# Patient Record
Sex: Male | Born: 1995 | Race: White | Hispanic: No | Marital: Single | Smoking: Never smoker
Health system: Southern US, Community
[De-identification: ages and names within clinical notes are randomized; demographics above are authoritative.]

## PROBLEM LIST (undated history)

## (undated) DIAGNOSIS — H548 Legal blindness, as defined in USA: Secondary | ICD-10-CM

## (undated) DIAGNOSIS — J690 Pneumonitis due to inhalation of food and vomit: Secondary | ICD-10-CM

## (undated) DIAGNOSIS — I10 Essential (primary) hypertension: Secondary | ICD-10-CM

## (undated) DIAGNOSIS — T4145XA Adverse effect of unspecified anesthetic, initial encounter: Secondary | ICD-10-CM

## (undated) DIAGNOSIS — G808 Other cerebral palsy: Secondary | ICD-10-CM

## (undated) DIAGNOSIS — R112 Nausea with vomiting, unspecified: Secondary | ICD-10-CM

## (undated) DIAGNOSIS — Z8489 Family history of other specified conditions: Secondary | ICD-10-CM

## (undated) DIAGNOSIS — J189 Pneumonia, unspecified organism: Secondary | ICD-10-CM

## (undated) DIAGNOSIS — T8859XA Other complications of anesthesia, initial encounter: Secondary | ICD-10-CM

## (undated) DIAGNOSIS — H539 Unspecified visual disturbance: Secondary | ICD-10-CM

## (undated) DIAGNOSIS — K219 Gastro-esophageal reflux disease without esophagitis: Secondary | ICD-10-CM

## (undated) DIAGNOSIS — E739 Lactose intolerance, unspecified: Secondary | ICD-10-CM

## (undated) DIAGNOSIS — K5900 Constipation, unspecified: Secondary | ICD-10-CM

## (undated) DIAGNOSIS — B974 Respiratory syncytial virus as the cause of diseases classified elsewhere: Secondary | ICD-10-CM

## (undated) DIAGNOSIS — Z9889 Other specified postprocedural states: Secondary | ICD-10-CM

## (undated) DIAGNOSIS — R569 Unspecified convulsions: Secondary | ICD-10-CM

## (undated) DIAGNOSIS — B338 Other specified viral diseases: Secondary | ICD-10-CM

## (undated) HISTORY — PX: HIATAL HERNIA REPAIR: SHX195

## (undated) HISTORY — PX: OTHER SURGICAL HISTORY: SHX169

## (undated) HISTORY — PX: ADENOIDECTOMY: SUR15

## (undated) HISTORY — PX: GASTROSTOMY TUBE PLACEMENT: SHX655

---
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
## 2016-02-05 DIAGNOSIS — I7300 Raynaud's syndrome without gangrene: Secondary | ICD-10-CM | POA: Insufficient documentation

## 2016-02-21 DIAGNOSIS — K089 Disorder of teeth and supporting structures, unspecified: Secondary | ICD-10-CM | POA: Insufficient documentation

## 2016-03-11 ENCOUNTER — Emergency Department (HOSPITAL_COMMUNITY): Payer: Medicaid Other

## 2016-03-11 ENCOUNTER — Emergency Department (HOSPITAL_COMMUNITY)
Admission: EM | Admit: 2016-03-11 | Discharge: 2016-03-11 | Disposition: A | Discharge status: Home / Self Care | Payer: Medicaid Other | Attending: Emergency Medicine | Admitting: Emergency Medicine

## 2016-03-11 ENCOUNTER — Encounter (HOSPITAL_COMMUNITY): Payer: Self-pay | Admitting: Emergency Medicine

## 2016-03-11 DIAGNOSIS — H663X9 Other chronic suppurative otitis media, unspecified ear: Secondary | ICD-10-CM | POA: Diagnosis not present

## 2016-03-11 DIAGNOSIS — R938 Abnormal findings on diagnostic imaging of other specified body structures: Secondary | ICD-10-CM | POA: Insufficient documentation

## 2016-03-11 DIAGNOSIS — R0682 Tachypnea, not elsewhere classified: Secondary | ICD-10-CM

## 2016-03-11 DIAGNOSIS — J40 Bronchitis, not specified as acute or chronic: Secondary | ICD-10-CM | POA: Diagnosis not present

## 2016-03-11 DIAGNOSIS — Z79899 Other long term (current) drug therapy: Secondary | ICD-10-CM | POA: Insufficient documentation

## 2016-03-11 DIAGNOSIS — R0602 Shortness of breath: Secondary | ICD-10-CM | POA: Diagnosis present

## 2016-03-11 DIAGNOSIS — I10 Essential (primary) hypertension: Secondary | ICD-10-CM | POA: Diagnosis not present

## 2016-03-11 LAB — CBC WITH DIFFERENTIAL/PLATELET
Basophils Absolute: 0 10*3/uL (ref 0.0–0.1)
Basophils Relative: 0 %
EOS ABS: 0.1 10*3/uL (ref 0.0–0.7)
EOS PCT: 1 %
HCT: 51.3 % (ref 39.0–52.0)
Hemoglobin: 17.8 g/dL — ABNORMAL HIGH (ref 13.0–17.0)
LYMPHS ABS: 3.3 10*3/uL (ref 0.7–4.0)
Lymphocytes Relative: 34 %
MCH: 31.1 pg (ref 26.0–34.0)
MCHC: 34.7 g/dL (ref 30.0–36.0)
MCV: 89.7 fL (ref 78.0–100.0)
Monocytes Absolute: 0.9 10*3/uL (ref 0.1–1.0)
Monocytes Relative: 9 %
Neutro Abs: 5.4 10*3/uL (ref 1.7–7.7)
Neutrophils Relative %: 56 %
PLATELETS: 255 10*3/uL (ref 150–400)
RBC: 5.72 MIL/uL (ref 4.22–5.81)
RDW: 12.9 % (ref 11.5–15.5)
WBC: 9.7 10*3/uL (ref 4.0–10.5)

## 2016-03-11 LAB — I-STAT CG4 LACTIC ACID, ED: LACTIC ACID, VENOUS: 1.78 mmol/L (ref 0.5–1.9)

## 2016-03-11 LAB — BASIC METABOLIC PANEL
ANION GAP: 12 (ref 5–15)
BUN: 15 mg/dL (ref 6–20)
CALCIUM: 10.1 mg/dL (ref 8.9–10.3)
CO2: 25 mmol/L (ref 22–32)
CREATININE: 0.73 mg/dL (ref 0.61–1.24)
Chloride: 104 mmol/L (ref 101–111)
GFR calc non Af Amer: >60 mL/min (ref >60)
Glucose, Bld: 102 mg/dL — ABNORMAL HIGH (ref 65–99)
Potassium: 4 mmol/L (ref 3.5–5.1)
SODIUM: 141 mmol/L (ref 135–145)

## 2016-03-11 LAB — I-STAT VENOUS BLOOD GAS, ED
ACID-BASE EXCESS: 4 mmol/L — AB (ref 0.0–2.0)
Bicarbonate: 28.8 mmol/L — ABNORMAL HIGH (ref 20.0–28.0)
O2 Saturation: 79 %
TCO2: 30 mmol/L (ref 0–100)
pCO2, Ven: 40.6 mmHg — ABNORMAL LOW (ref 44.0–60.0)
pH, Ven: 7.459 — ABNORMAL HIGH (ref 7.250–7.430)
pO2, Ven: 41 mmHg (ref 32.0–45.0)

## 2016-03-11 LAB — URINALYSIS, ROUTINE W REFLEX MICROSCOPIC
BILIRUBIN URINE: NEGATIVE
GLUCOSE, UA: NEGATIVE mg/dL
HGB URINE DIPSTICK: NEGATIVE
KETONES UR: NEGATIVE mg/dL
LEUKOCYTES UA: NEGATIVE
Nitrite: NEGATIVE
PROTEIN: NEGATIVE mg/dL
Specific Gravity, Urine: 1.009 (ref 1.005–1.030)
pH: 8 (ref 5.0–8.0)

## 2016-03-11 LAB — RAPID STREP SCREEN (MED CTR MEBANE ONLY): STREPTOCOCCUS, GROUP A SCREEN (DIRECT): NEGATIVE

## 2016-03-11 MED ORDER — CEFTRIAXONE SODIUM 1 G IJ SOLR
1.0000 g | Freq: Once | INTRAMUSCULAR | Status: AC
  Administered 2016-03-11: 1 g via INTRAMUSCULAR
  Filled 2016-03-11: qty 10

## 2016-03-11 MED ORDER — ACETAMINOPHEN 160 MG/5ML PO SOLN
1000.0000 mg | Freq: Once | ORAL | Status: AC
  Administered 2016-03-11: 1000 mg
  Filled 2016-03-11: qty 40.6

## 2016-03-11 MED ORDER — LAMOTRIGINE 150 MG PO TABS
150.0000 mg | ORAL_TABLET | Freq: Two times a day (BID) | ORAL | Status: DC
  Administered 2016-03-11: 150 mg
  Filled 2016-03-11 (×2): qty 1

## 2016-03-11 MED ORDER — CETIRIZINE HCL 5 MG/5ML PO SYRP
10.0000 mg | ORAL_SOLUTION | Freq: Every day | ORAL | Status: DC
  Administered 2016-03-11: 10 mg
  Filled 2016-03-11: qty 10

## 2016-03-11 MED ORDER — LIDOCAINE HCL (PF) 1 % IJ SOLN
INTRAMUSCULAR | Status: AC
  Administered 2016-03-11: 2.1 mL
  Filled 2016-03-11: qty 5

## 2016-03-11 MED ORDER — CLONAZEPAM 0.125 MG PO TBDP
0.5000 mg | ORAL_TABLET | Freq: Once | ORAL | Status: AC
  Administered 2016-03-11: 0.5 mg
  Filled 2016-03-11: qty 4

## 2016-03-11 MED ORDER — GLYCOPYRROLATE 1 MG PO TABS
3.0000 mg | ORAL_TABLET | Freq: Two times a day (BID) | ORAL | Status: DC
  Administered 2016-03-11: 3 mg
  Filled 2016-03-11: qty 3

## 2016-03-11 MED ORDER — PANTOPRAZOLE SODIUM 40 MG PO PACK
20.0000 mg | PACK | Freq: Two times a day (BID) | ORAL | Status: DC
  Administered 2016-03-11: 20 mg
  Filled 2016-03-11: qty 20

## 2016-03-11 MED ORDER — LEVALBUTEROL HCL 1.25 MG/0.5ML IN NEBU
1.2500 mg | INHALATION_SOLUTION | Freq: Once | RESPIRATORY_TRACT | Status: AC
  Administered 2016-03-11: 1.25 mg via RESPIRATORY_TRACT
  Filled 2016-03-11: qty 0.5

## 2016-03-11 MED ORDER — METOPROLOL TARTRATE 25 MG PO TABS
37.5000 mg | ORAL_TABLET | Freq: Two times a day (BID) | ORAL | Status: DC
  Administered 2016-03-11: 37.5 mg
  Filled 2016-03-11: qty 2

## 2016-03-11 MED ORDER — LEVALBUTEROL HCL 0.63 MG/3ML IN NEBU
0.6300 mg | INHALATION_SOLUTION | Freq: Once | RESPIRATORY_TRACT | Status: AC
  Administered 2016-03-11: 0.63 mg via RESPIRATORY_TRACT
  Filled 2016-03-11: qty 3

## 2016-03-11 MED ORDER — SODIUM CHLORIDE 0.9 % IV BOLUS (SEPSIS)
1000.0000 mL | Freq: Once | INTRAVENOUS | Status: AC
  Administered 2016-03-11: 1000 mL via INTRAVENOUS

## 2016-03-11 MED ORDER — IOPAMIDOL (ISOVUE-370) INJECTION 76%
INTRAVENOUS | Status: AC
  Administered 2016-03-11: 100 mL
  Filled 2016-03-11: qty 100

## 2016-03-11 MED ORDER — LEVETIRACETAM 100 MG/ML PO SOLN
700.0000 mg | Freq: Two times a day (BID) | ORAL | Status: DC
  Administered 2016-03-11: 700 mg
  Filled 2016-03-11: qty 7.5

## 2016-03-11 NOTE — Discharge Instructions (Signed)
Please follow up with Dr. Chestine Sporelark as scheduled tomorrow.  Return if worse at any time.

## 2016-03-11 NOTE — ED Provider Notes (Signed)
This is an 20 year old male with cerebral palsy, seizure disorder, chronic ear infections signed out to me by Dr. Clarene DukeLittle. He has been being followed for  a multi-drug-resistant Escherichia coli ear infection and bronchitis. He received Rocephin in the office yesterday and was scheduled to receive a second dose today. After he presented to the ED to have some increased work of breathing and they were concerned that he had pneumonia and was sent to the ED for further evaluation. He was seen and evaluated by Dr. Clarene DukeLittle with lab work and chest x-Peterson Mathey. Lab work is normal and chest x-Latima Hamza did not seem to show any acute infiltrate. Due to the difficult evaluation of this patient and concern for occult infection a CT of the chest and abdomen was performed. There appears to be no acute infection. In the interim he has had resolution of grunting that was present earlier. His oxygen saturations are 95% with heart rate in the 90s and he is mild to moderately hypertensive. His mother feels that he is improved and looks better than he has a past 36 hours. He is given his dose of Rocephin here in the ED. I discussed the patient's care with Dr. Pricilla Holmucker, on-call for Dr. Chestine Sporelark. He is scheduled to be followed up in the morning with Dr. Chestine Sporelark. He is discharged home in improved condition.   Margarita Grizzleanielle Tifani Dack, MD 03/11/16 2242

## 2016-03-11 NOTE — ED Triage Notes (Signed)
Pt caregiver states "hes having a lot of issues breathing this morning" pt went to pediatricians and was sent here, [facility] started [facility], yesterday c/o issue breathing,pt did series of antibiotics for ear pain. Pt has cerebral palsy, is nonverbal. Pt extensive hx of respiratory issues. Pts breathing is very difficult. Caregiver states he looks very poor.

## 2016-03-11 NOTE — ED Notes (Signed)
Pt's blood pressure 151/122.  Informed Molly, RN.

## 2016-03-11 NOTE — ED Notes (Signed)
Pharmacist notified on pt.'s pending medication order.  

## 2016-03-11 NOTE — ED Notes (Signed)
Patient transported to CT scan . 

## 2016-03-11 NOTE — ED Notes (Signed)
Dr. Rosalia Hammersay explained tests results and plan of care to pt.'s family .

## 2016-03-11 NOTE — ED Notes (Signed)
Pharmacist notified for pt.'s Protonix order.

## 2016-03-11 NOTE — ED Provider Notes (Signed)
[facility]-EMERGENCY DEPT Provider Note   CSN: [id number] Arrival date & time: 03/11/16  1259     History   Chief Complaint Chief Complaint  Patient presents with  . Shortness of Breath    HPI Christopher Caldwell is a 20 y.o. male.  20yo M w/ PMH including cerebral palsy, chronic ear infections, seizures, quadriplegia who presents with respiratory distress. Caregiver reports that the patient has intermittent episodes of increased work of breathing that usually last one hour and spontaneously resolve. Yesterday morning, he began having respiratory difficulty and the symptoms have persisted throughout yesterday and today. He has chronic ear infections and yesterday was started on ceftriaxone. He returned to the PCP today for a second dose and was sent here due to his breathing difficulty. She states that he has an occasional weak cough. No fever. He did have some dry heaving this morning. No diarrhea.  LEVEL 5 CAVEAT DUE TO PATIENT NON-VERBAL   The history is provided by a caregiver.  Shortness of Breath     Past Medical History:  Diagnosis Date  . Aspiration pneumonia (HCC)   . Asthma   . Cerebral palsy, quadriplegic (HCC)   . Complication of anesthesia   . Constipation   . Family history of adverse reaction to anesthesia    Mother - Vomitting, Flu symptoms  . Hypertension   . Lactose intolerance   . Legally blind   . Pneumonia   . PONV (postoperative nausea and vomiting)    did not get sick with 07/13/14 OR- with medication and he woke up better.  . RSV (respiratory syncytial virus infection)   . Seizures (HCC)   . Vision abnormalities     Patient Active Problem List   Diagnosis Date Noted  . Respiratory illness with fever 08/10/2015  . Dyspnea 08/09/2015  . Infection due to human metapneumovirus (hMPV)   . Aspiration pneumonia (HCC)   . Developmental delay   . Viral illness   . Dehydration   . Respiratory distress 04/17/2014    Past Surgical History:  Procedure  Laterality Date  . ADENOIDECTOMY    . CHOLECYSTECTOMY  08/2014  . GASTROSTOMY TUBE PLACEMENT     age 65 months- replaced - 05/2014  . hamstring resection Bilateral   . HIATAL HERNIA REPAIR    . MYRINGOTOMY  03/26/2011   Procedure: MYRINGOTOMY;  Surgeon: Leonette Most;  Location: [facility] OR;  Service: ENT;  Laterality: Bilateral;  BILATERAL MYRINGOTOMY   . MYRINGOTOMY WITH TUBE PLACEMENT Bilateral 01/06/2014   Procedure: MYRINGOTOMY WITH TUBE PLACEMENT;  Surgeon: Suzanna Obey, MD;  Location: [facility] OR;  Service: ENT;  Laterality: Bilateral;  Bilateral myringotomy with tube placement and nasal endoscopy  . MYRINGOTOMY WITH TUBE PLACEMENT Bilateral 07/13/2014   Procedure: MYRINGOTOMY WITH TUBE PLACEMENT;  Surgeon: Suzanna Obey, MD;  Location[facility] OR;  Service: ENT;  Laterality: Bilateral;  bilateral T Tubes  . MYRINGOTOMY WITH TUBE PLACEMENT Bilateral 04/20/2015   Procedure: MYRINGOTOMY WITH TUBE PLACEMENT;  Surgeon: Suzanna Obey, MD;  Location: [facility] OR;  Service: ENT;  Laterality: Bilateral;  . MYRINGOTOMY WITH TUBE PLACEMENT Bilateral 10/15/2015   Procedure: MYRINGOTOMY WITH TUBE PLACEMENT;  Surgeon: Suzanna Obey, MD;  Location: [facility] OR;  Service: ENT;  Laterality: Bilateral;  sheehy tubes   . Nissan Plundication         Home Medications    Prior to Admission medications   Medication Sig Start Date End Date Taking? Authorizing Provider  cetirizine HCl (ZYRTEC) 5 MG/5ML SYRP Place 10  mg into feeding tube daily.   Yes Historical Provider, MD  ciprofloxacin-dexamethasone (CIPRODEX) otic suspension Place 5 drops into both ears 2 (two) times daily.   Yes Historical Provider, MD  clonazePAM (KLONOPIN) 0.5 MG tablet 0.5 mg by PEG Tube route 4 (four) times daily. Times to take it at 4am, 10am, 3pm, 7pm    Yes Historical Provider, MD  diazepam (DIASTAT ACUDIAL) 10 MG GEL Place 15 mg rectally as needed for seizure.    Yes Historical Provider, MD  diazepam (VALIUM) 1 MG/ML solution Place 15 mg into feeding tube 3 (three)  times daily as needed for muscle spasms (seizures. up to three times within 30 minutes).    Yes Historical Provider, MD  fluconazole (DIFLUCAN) 40 MG/ML suspension Take 400 mg by mouth daily.   Yes Historical Provider, MD  glycopyrrolate (ROBINUL) 1 MG tablet Place 3 mg into feeding tube 2 (two) times daily. Dissolve in water and Give via G tube.   Yes Historical Provider, MD  lactobacillus acidophilus (BACID) TABS tablet Place 1 tablet into feeding tube daily.    Yes Historical Provider, MD  lamoTRIgine (LAMICTAL) 150 MG tablet Place 150 mg into feeding tube 2 (two) times daily. Dissolve in water and give via G tube.   Yes Historical Provider, MD  levalbuterol (XOPENEX) 0.63 MG/3ML nebulizer solution Take 3 mLs (0.63 mg total) by nebulization every 4 (four) hours as needed for wheezing or shortness of breath. Patient taking differently: Take 0.63 mg by nebulization 3 (three) times daily.  04/19/14  Yes Warnell ForesterAkilah Grimes, MD  levETIRAcetam (KEPPRA) 100 MG/ML solution Place into feeding tube 2 (two) times daily. Give at 6AM, 5:30pm   Yes Historical Provider, MD  metoprolol tartrate (LOPRESSOR) 25 MG tablet Place 37.5 mg into feeding tube 2 (two) times daily. Dissolve in water and give via G Tube.   Yes Historical Provider, MD  pantoprazole sodium (PROTONIX) 40 mg/20 mL PACK Place 20 mg into feeding tube 2 (two) times daily.    Yes Historical Provider, MD  polyethylene glycol (MIRALAX / GLYCOLAX) packet Place 60 g into feeding tube daily as needed. Patient taking differently: Place 60 g into feeding tube daily as needed for mild constipation.  04/19/14  Yes Warnell ForesterAkilah Grimes, MD  sucralfate (CARAFATE) 1 GM/10ML suspension Take 1 g by mouth 2 (two) times daily as needed. For stomach per mother   Yes Historical Provider, MD  temazepam (RESTORIL) 30 MG capsule Place 30 mg into feeding tube at bedtime. Break open, Dissolve in water and give via G tube. Take at 7pm   Yes Historical Provider, MD    Family  History Family History  Problem Relation Age of Onset  . Asthma Mother   . Hypertension Mother   . Hypertension Father   . Asthma Sister   . Arthritis Maternal Grandmother   . Hypertension Maternal Grandmother   . Arthritis Maternal Grandfather   . Diabetes Maternal Grandfather   . Heart disease Maternal Grandfather   . Arthritis Paternal Grandmother   . COPD Paternal Grandmother     Social History Social History  Substance Use Topics  . Smoking status: Never Smoker  . Smokeless tobacco: Not on file  . Alcohol use No     Allergies   Valproic acid; Albuterol; Amlodipine; Bioflavonoids; Citrus; Clonidine; Labetalol; Lactose; Lorazepam; Mangifera indica; Morphine; Ondansetron; Strawberry extract; Vancomycin; Zofran; Carbamazepine; and Isradipine   Review of Systems Review of Systems  Unable to perform ROS: Patient nonverbal  Respiratory: Positive for shortness of  breath.      Physical Exam Updated Vital Signs BP 109/98 (BP Location: Left Arm)   Pulse 93   Temp 99.9 F (37.7 C) (Rectal)   Resp 21   SpO2 96%   Physical Exam  Constitutional: He appears well-developed and well-nourished.  Grunting, in mild distress, tachypneic  HENT:  Head: Normocephalic and atraumatic.  Moist mucous membranes B/l tympanoplasty tubes in place, no drainage in ear canals  Eyes: Conjunctivae are normal. Pupils are equal, round, and reactive to light.  Neck: Neck supple.  Cardiovascular: Normal rate, regular rhythm and normal heart sounds.   No murmur heard. Pulmonary/Chest: Tachypnea noted. No respiratory distress.  Increased WOB with nasal flaring; diminished BS b/l, occasional grunting  Abdominal: Soft. Bowel sounds are normal. He exhibits no distension. There is no tenderness.  g-tube in place without drainage or erythema  Musculoskeletal: He exhibits no edema.  Global muscle atrophy and contracted extremities  Neurological:  Eyes open, contractured extremities  Skin: Skin is  warm and dry. No rash noted. No erythema.  Nursing note and vitals reviewed.    ED Treatments / Results  Labs (all labs ordered are listed, but only abnormal results are displayed) Labs Reviewed  CBC WITH DIFFERENTIAL/PLATELET - Abnormal; Notable for the following:       Result Value   Hemoglobin 17.8 (*)    All other components within normal limits  BASIC METABOLIC PANEL - Abnormal; Notable for the following:    Glucose, Bld 102 (*)    All other components within normal limits  I-STAT VENOUS BLOOD GAS, ED - Abnormal; Notable for the following:    pH, Ven 7.459 (*)    pCO2, Ven 40.6 (*)    Bicarbonate 28.8 (*)    Acid-Base Excess 4.0 (*)    All other components within normal limits  CULTURE, BLOOD (ROUTINE X 2)  CULTURE, BLOOD (ROUTINE X 2)  URINE CULTURE  RAPID STREP SCREEN (NOT AT [facility])  BLOOD GAS, VENOUS  URINALYSIS, ROUTINE W REFLEX MICROSCOPIC (NOT AT [facility])  I-STAT CG4 LACTIC ACID, ED    EKG  EKG Interpretation None       Radiology Dg Chest 2 View  Result Date: 03/11/2016 CLINICAL DATA:  Fever, shortness of breath and congestion in a patient with cerebral palsy. EXAM: CHEST  2 VIEW COMPARISON:  PA and lateral chest 10/18/2015 and 01/30/2015. FINDINGS: Distorted appearance of the thoracic cage is only changed. Convex left scoliosis is seen. Lungs appear clear. No pneumothorax or pleural effusion. Heart size is normal. IMPRESSION: No acute disease. Electronically Signed   By: Drusilla Kanner M.D.   On: 03/11/2016 15:49    Procedures Procedures (including critical care time)  Medications Ordered in ED Medications  sodium chloride 0.9 % bolus 1,000 mL (not administered)  acetaminophen (TYLENOL) solution 1,000 mg (not administered)  clonazepam (KLONOPIN) disintegrating tablet 0.5 mg (not administered)  levalbuterol (XOPENEX) nebulizer solution 1.25 mg (1.25 mg Nebulization Given 03/11/16 1440)     Initial Impression / Assessment and Plan / ED Course  I have  reviewed the triage vital signs and the nursing notes.  Pertinent labs & imaging results that were available during my care of the patient were reviewed by me and considered in my medical decision making (see chart for details).  Clinical Course     Pt w/ h/o breathing problems p/w 2days of worsening SOB. He was tachypneic on exam w/ increased WOB and nasal flaring, no respiratory distress. O2 sat 95% on RA. Diminished  BS, difficult to auscultate 2/2 grunting and chest wall deformity. Obtained above labs and CXR and gave breathing treatment.   Labs show venous pH 7.46, CO2 40. Unremarkable CBC and BMP, normal lactate. Urine is pending. Chest x-ray showed no acute findings. The patient's grunting and tachypnea may be a pain or discomfort response rather than primary respiratory issue. Because the patient is nonverbal, we will obtain a CT of the chest, abdomen, and pelvis to evaluate for acute process to explain his symptoms. I have also ordered a rapid strep test. I'm signing out to the oncoming provider who will follow-up on the patient's studies. I have ordered IV fluids, Tylenol, and afternoon dose of Klonopin per mom's request.  Final Clinical Impressions(s) / ED Diagnoses   Final diagnoses:  None    New Prescriptions New Prescriptions   No medications on file     Laurence Spatesachel Morgan Little, MD 03/11/16 1631

## 2016-03-12 LAB — URINE CULTURE
CULTURE: NO GROWTH
SPECIAL REQUESTS: NORMAL

## 2016-03-14 LAB — CULTURE, GROUP A STREP (THRC)

## 2016-03-16 LAB — CULTURE, BLOOD (ROUTINE X 2)
CULTURE: NO GROWTH
CULTURE: NO GROWTH

## 2016-03-18 ENCOUNTER — Other Ambulatory Visit (HOSPITAL_COMMUNITY): Payer: Self-pay | Admitting: Otolaryngology

## 2016-07-06 DIAGNOSIS — K148 Other diseases of tongue: Secondary | ICD-10-CM | POA: Insufficient documentation

## 2016-07-06 DIAGNOSIS — E559 Vitamin D deficiency, unspecified: Secondary | ICD-10-CM | POA: Insufficient documentation

## 2017-01-07 IMAGING — CT CT ABD-PELV W/ CM
2 of 12 series · 3 of 46 positions shown, 6 images · IV contrast (Iodine)
Comparison: None.

CLINICAL DATA: 19 y/o M; shortness of breath, tachypnea, labored
breathing, and grunting. History of cerebral palsy, asthma,
pneumonia, cholecystectomy, and appendectomy.



[Series 200: locator · axial · 0.49mm/px · z∈[+352,+352]mm · 1 of 1 slices shown, 3 images]
[im 1/1  soft-tissue]
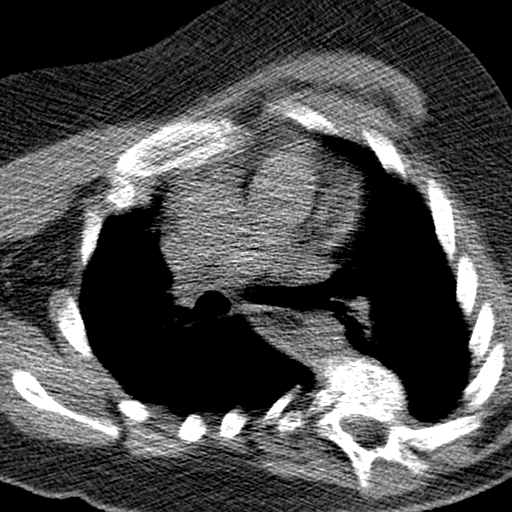
[im 1/1  lung]
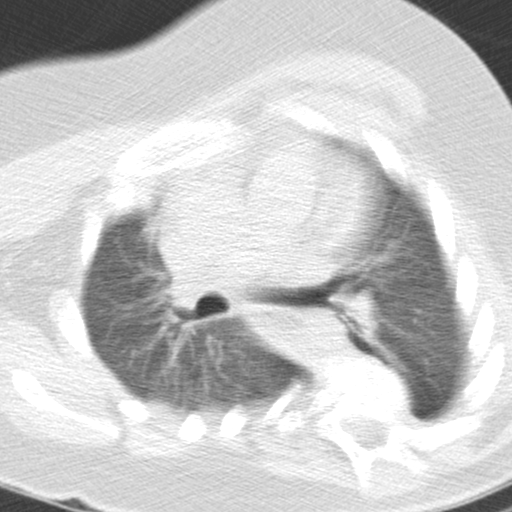
[im 1/1  bone]
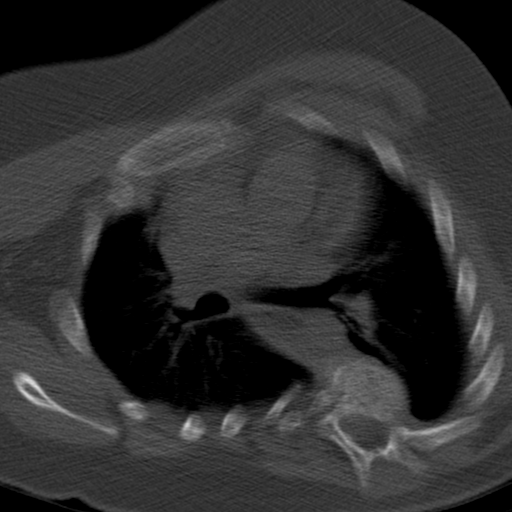

[Series 404: cor · coronal · 0.74mm/px · 2 of 155 slices shown, 3 images]
[im 52/155  soft-tissue]
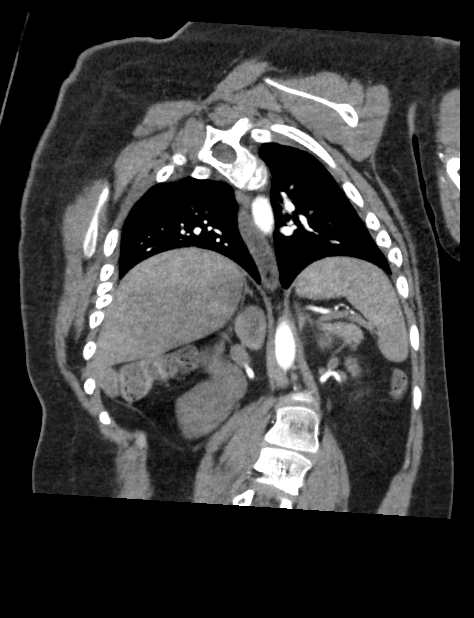
[im 52/155  bone]
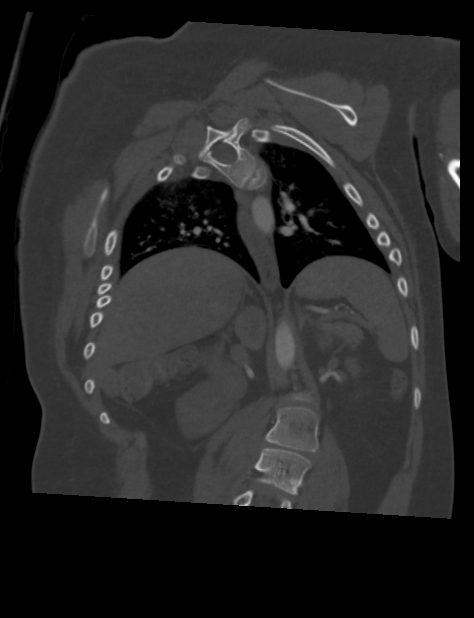
[im 103/155  soft-tissue]
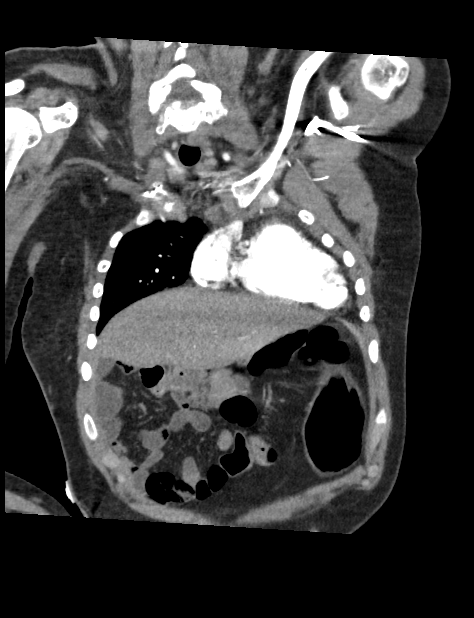

[3 of 46 positions shown; findings below may reference images not displayed]

FINDINGS: CTA CHEST FINDINGS

Cardiovascular: Suboptimal evaluation of the lung apices due to
respiratory motion artifact. Satisfactory opacification of pulmonary
arteries to segmental level. No central, lobar, or basilar pulmonary
embolus identified. Normal caliber thoracic aorta.

Mediastinum/Nodes: Patulous and fluid-filled esophagus. No
mediastinal adenopathy. Normal thyroid gland.

Lungs/Pleura: Mosaic attenuation of the lung parenchyma is likely
represent small airways disease given history of aspiration and
asthma. No pulmonary consolidation. No pleural effusion or
pneumothorax.

Musculoskeletal: Gracile ribs and severe rotatory levoscoliosis of
thoracic spine. No acute osseous abnormality is identified.

Review of the MIP images confirms the above findings.

CT ABDOMEN and PELVIS FINDINGS

Hepatobiliary: No focal liver abnormality is seen. Status post
cholecystectomy. No biliary dilatation.

Pancreas: Unremarkable. No pancreatic ductal dilatation or
surrounding inflammatory changes.

Spleen: Punctate splenic calcifications probably represents sequelae
of prior infectious/inflammatory process.

Adrenals/Urinary Tract: Adrenal glands are unremarkable. Kidneys are
normal, without renal calculi, focal lesion, or hydronephrosis.
Bladder is unremarkable.

Stomach/Bowel: Stomach is within normal limits. Percutaneous
gastrostomy balloon is within the gastric body. No evidence of bowel
wall thickening, distention, or inflammatory changes.

Vascular/Lymphatic: No significant vascular findings are present. No
enlarged abdominal or pelvic lymph nodes.

Reproductive: Prostate is unremarkable. High-riding testicles in the
inguinal canals bilaterally.

Other: No abdominal wall hernia or abnormality. No abdominopelvic
ascites.

Musculoskeletal: Bilateral hip joint shallow acetabula and with
superior subluxation of femoral heads in pseudoarthrosis. No acute
bony abnormality identified.

Review of the MIP images confirms the above findings.
IMPRESSION: 1. Respiratory motion artifact in the lung apices. No central,
lobar, or basilar pulmonary embolus.
2. Mosaic attenuation of lung parenchyma probably representing small
airways disease given history of aspiration and asthma.
3. Patulous fluid-filled esophagus.
4. No acute process in the abdomen or pelvis identified.
5. High riding testicles in inguinal canals bilaterally.
6. Superior subluxation of femoral heads and pseudoarthrosis.

By: Auntyjatty Delowr M.D.

## 2017-02-11 DIAGNOSIS — I77810 Thoracic aortic ectasia: Secondary | ICD-10-CM | POA: Insufficient documentation

## 2017-06-04 DIAGNOSIS — R638 Other symptoms and signs concerning food and fluid intake: Secondary | ICD-10-CM | POA: Insufficient documentation

## 2017-08-19 ENCOUNTER — Other Ambulatory Visit: Payer: Self-pay

## 2017-08-19 ENCOUNTER — Encounter (HOSPITAL_COMMUNITY): Payer: Self-pay | Admitting: *Deleted

## 2017-08-19 ENCOUNTER — Other Ambulatory Visit: Payer: Self-pay | Admitting: Otolaryngology

## 2017-08-19 NOTE — Progress Notes (Signed)
Spoke with pt's mother, Zella BallRobin for pre-op call. She states pt does not have a cardiac history. 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 Instructed mom when she gives pt his meds in the AM, to use the least amount of water to flush the peg tube. She voiced understanding.

## 2017-08-20 ENCOUNTER — Encounter (HOSPITAL_COMMUNITY): Payer: Self-pay | Admitting: *Deleted

## 2017-08-20 ENCOUNTER — Encounter (HOSPITAL_COMMUNITY)
Admission: RE | Disposition: A | Discharge status: Home / Self Care | Payer: Self-pay | Source: Ambulatory Visit | Attending: Otolaryngology

## 2017-08-20 ENCOUNTER — Ambulatory Visit (HOSPITAL_COMMUNITY): Payer: Medicaid Other | Admitting: Anesthesiology

## 2017-08-20 ENCOUNTER — Other Ambulatory Visit: Payer: Self-pay

## 2017-08-20 ENCOUNTER — Ambulatory Visit (HOSPITAL_COMMUNITY)
Admission: RE | Admit: 2017-08-20 | Discharge: 2017-08-20 | Disposition: A | Discharge status: Home / Self Care | Payer: Medicaid Other | Source: Ambulatory Visit | Attending: Otolaryngology | Admitting: Otolaryngology

## 2017-08-20 DIAGNOSIS — H548 Legal blindness, as defined in USA: Secondary | ICD-10-CM | POA: Insufficient documentation

## 2017-08-20 DIAGNOSIS — K219 Gastro-esophageal reflux disease without esophagitis: Secondary | ICD-10-CM | POA: Diagnosis not present

## 2017-08-20 DIAGNOSIS — Z79899 Other long term (current) drug therapy: Secondary | ICD-10-CM | POA: Diagnosis not present

## 2017-08-20 DIAGNOSIS — Z791 Long term (current) use of non-steroidal anti-inflammatories (NSAID): Secondary | ICD-10-CM | POA: Insufficient documentation

## 2017-08-20 DIAGNOSIS — H6690 Otitis media, unspecified, unspecified ear: Secondary | ICD-10-CM | POA: Insufficient documentation

## 2017-08-20 DIAGNOSIS — G808 Other cerebral palsy: Secondary | ICD-10-CM | POA: Insufficient documentation

## 2017-08-20 DIAGNOSIS — F419 Anxiety disorder, unspecified: Secondary | ICD-10-CM | POA: Insufficient documentation

## 2017-08-20 DIAGNOSIS — Z931 Gastrostomy status: Secondary | ICD-10-CM | POA: Insufficient documentation

## 2017-08-20 DIAGNOSIS — R569 Unspecified convulsions: Secondary | ICD-10-CM | POA: Insufficient documentation

## 2017-08-20 DIAGNOSIS — Z993 Dependence on wheelchair: Secondary | ICD-10-CM | POA: Diagnosis not present

## 2017-08-20 DIAGNOSIS — J45909 Unspecified asthma, uncomplicated: Secondary | ICD-10-CM | POA: Insufficient documentation

## 2017-08-20 DIAGNOSIS — I10 Essential (primary) hypertension: Secondary | ICD-10-CM | POA: Insufficient documentation

## 2017-08-20 HISTORY — PX: MYRINGOTOMY WITH TUBE PLACEMENT: SHX5663

## 2017-08-20 SURGERY — MYRINGOTOMY WITH TUBE PLACEMENT
Anesthesia: General | Site: Ear | Laterality: Bilateral

## 2017-08-20 MED ORDER — PROPOFOL 10 MG/ML IV BOLUS
INTRAVENOUS | Status: DC | PRN
  Administered 2017-08-20: 50 mg via INTRAVENOUS

## 2017-08-20 MED ORDER — CIPROFLOXACIN-DEXAMETHASONE 0.3-0.1 % OT SUSP
OTIC | Status: DC | PRN
  Administered 2017-08-20: 2 [drp] via OTIC

## 2017-08-20 MED ORDER — SOD CITRATE-CITRIC ACID 500-334 MG/5ML PO SOLN: 30.0000 mL | Freq: Once | ORAL | Status: DC

## 2017-08-20 MED ORDER — LIDOCAINE HCL (CARDIAC) PF 100 MG/5ML IV SOSY
PREFILLED_SYRINGE | INTRAVENOUS | Status: DC | PRN
  Administered 2017-08-20: 80 mg via INTRAVENOUS

## 2017-08-20 MED ORDER — LACTATED RINGERS IV SOLN
INTRAVENOUS | Status: DC
  Administered 2017-08-20: 08:00:00 via INTRAVENOUS

## 2017-08-20 MED ORDER — LACTATED RINGERS IV SOLN: 100.0000 mL/h | INTRAVENOUS | Status: DC

## 2017-08-20 MED ORDER — LACTATED RINGERS IV SOLN
INTRAVENOUS | Status: DC | PRN
  Administered 2017-08-20: 10:00:00 via INTRAVENOUS

## 2017-08-20 MED ORDER — CIPROFLOXACIN-DEXAMETHASONE 0.3-0.1 % OT SUSP
OTIC | Status: AC
  Filled 2017-08-20: qty 7.5

## 2017-08-20 SURGICAL SUPPLY — 24 items
BALL CTTN LRG ABS STRL LF (GAUZE/BANDAGES/DRESSINGS)
BLADE MYRINGOTOMY 6 SPEAR HDL (BLADE) ×2 IMPLANT
BLADE MYRINGOTOMY 6" SPEAR HDL (BLADE) ×1
BLADE SURG 15 STRL LF DISP TIS (BLADE) IMPLANT
BLADE SURG 15 STRL SS (BLADE)
CANISTER SUCT 3000ML PPV (MISCELLANEOUS) ×3 IMPLANT
CONT SPEC 4OZ CLIKSEAL STRL BL (MISCELLANEOUS) IMPLANT
COTTONBALL LRG STERILE PKG (GAUZE/BANDAGES/DRESSINGS) IMPLANT
COVER MAYO STAND STRL (DRAPES) ×3 IMPLANT
CRADLE DONUT ADULT HEAD (MISCELLANEOUS) IMPLANT
DRAPE HALF SHEET 40X57 (DRAPES) IMPLANT
GLOVE ECLIPSE 7.5 STRL STRAW (GLOVE) ×3 IMPLANT
KIT BASIN OR (CUSTOM PROCEDURE TRAY) ×3 IMPLANT
KIT TURNOVER KIT B (KITS) ×3 IMPLANT
NEEDLE HYPO 25GX1X1/2 BEV (NEEDLE) IMPLANT
NS IRRIG 1000ML POUR BTL (IV SOLUTION) ×3 IMPLANT
PAD ARMBOARD 7.5X6 YLW CONV (MISCELLANEOUS) ×3 IMPLANT
PROS SHEEHY TY XOMED (OTOLOGIC RELATED) ×2
SYR BULB 3OZ (MISCELLANEOUS) IMPLANT
TOWEL OR 17X24 6PK STRL BLUE (TOWEL DISPOSABLE) ×3 IMPLANT
TUBE CONNECTING 12'X1/4 (SUCTIONS) ×1
TUBE CONNECTING 12X1/4 (SUCTIONS) ×2 IMPLANT
TUBE EAR SHEEHY BUTTON 1.27 (OTOLOGIC RELATED) ×4 IMPLANT
TUBING EXTENTION W/L.L. (IV SETS) ×3 IMPLANT

## 2017-08-20 NOTE — Anesthesia Procedure Notes (Signed)
Procedure Name: General with mask airway Date/Time: 08/20/2017 10:24 AM Performed by: Gwenyth AllegraAdami, Froilan Mclean, CRNA Pre-anesthesia Checklist: Patient identified, Emergency Drugs available, Suction available, Patient being monitored and Timeout performed Patient Re-evaluated:Patient Re-evaluated prior to induction Oxygen Delivery Method: Circle system utilized Preoxygenation: Pre-oxygenation with 100% oxygen Induction Type: IV induction Ventilation: Mask ventilation without difficulty

## 2017-08-20 NOTE — Transfer of Care (Signed)
Immediate Anesthesia Transfer of Care Note  Patient: Christopher Caldwell  Procedure(s) Performed: BILATERAL MYRINGOTOMY WITH TUBE PLACEMENT (Bilateral Ear)  Patient Location: PACU  Anesthesia Type:General  Level of Consciousness: sedated  Airway & Oxygen Therapy: Patient Spontanous Breathing and Patient connected to nasal cannula oxygen  Post-op Assessment: Report given to RN and Post -op Vital signs reviewed and stable  Post vital signs: Reviewed and stable  Last Vitals:  Vitals Value Taken Time  BP 110/73 08/20/2017 10:48 AM  Temp 36.5 C 08/20/2017 10:48 AM  Pulse 60 08/20/2017 10:51 AM  Resp 24 08/20/2017 10:51 AM  SpO2 98 % 08/20/2017 10:51 AM  Vitals shown include unvalidated device data.  Last Pain:  Vitals:   08/20/17 1048  TempSrc:   PainSc: 0-No pain         Complications: No apparent anesthesia complications

## 2017-08-20 NOTE — Op Note (Signed)
Preop/postop diagnosis: Chronic otitis media Procedure: Bilateral myringotomy and tubes Anesthesia: Gen. Estimated blood loss less than 5 mL Indications: 22 year old who's had a long-standing issue with chronic ear issues and multiple sets of tubes. Return for risk and benefits of the procedure and options were discussed all questions are answered and consent was obtained. Procedure: Patient taken the upper and placed in supine position after general mask ventilation anesthesia was placed in the right gaze position. Cerumen cleaned from the external auditory canal under or microscope direction the tube was removed with alligator forcep. There was some granulation tissue around its edge. It was suctioned off. The new tympanostomy tube was placed without difficulty. Ciprodex was instilled. This was a Sheehy tube. Right side repeated in the same fashion and tube removed ear cleaned up of crusting and debris. Mostly this ear was dry. Sheehy tube placed without difficulty. Ciprodex instilled. Patient was then awakened brought to recovery in stable condition counts correct

## 2017-08-20 NOTE — Anesthesia Preprocedure Evaluation (Addendum)
Anesthesia Evaluation  Patient identified by MRN, date of birth, ID band Patient awake    Reviewed: Allergy & Precautions, NPO status , Patient's Chart, lab work & pertinent test results, reviewed documented beta blocker date and time   History of Anesthesia Complications (+) PONV, Family history of anesthesia reaction and history of anesthetic complications  Airway Mallampati: IV      Comment: Non cooperative Dental  (+) Dental Advisory Given   Pulmonary asthma ,    breath sounds clear to auscultation       Cardiovascular hypertension, Pt. on medications and Pt. on home beta blockers  Rhythm:Regular     Neuro/Psych Seizures -,  PSYCHIATRIC DISORDERS Anxiety CP    GI/Hepatic Neg liver ROS, GERD  Medicated,  Endo/Other  negative endocrine ROS  Renal/GU negative Renal ROS     Musculoskeletal negative musculoskeletal ROS (+)   Abdominal   Peds  Hematology negative hematology ROS (+)   Anesthesia Other Findings Day of surgery medications reviewed with the patient.  Reproductive/Obstetrics                             Anesthesia Physical  Anesthesia Plan  ASA: III  Anesthesia Plan: General   Post-op Pain Management:    Induction: Inhalational  PONV Risk Score and Plan: 2 and Dexamethasone and Treatment may vary due to age or medical condition  Airway Management Planned: Mask  Additional Equipment: None  Intra-op Plan:   Post-operative Plan:   Informed Consent: I have reviewed the patients History and Physical, chart, labs and discussed the procedure including the risks, benefits and alternatives for the proposed anesthesia with the patient or authorized representative who has indicated his/her understanding and acceptance.   Dental advisory given  Plan Discussed with: CRNA, Anesthesiologist and Surgeon  Anesthesia Plan Comments:        Anesthesia Quick Evaluation

## 2017-08-20 NOTE — H&P (Addendum)
Christopher Caldwell is an 22 y.o. male.   Chief Complaint: chronic ear issue HPI: hx of multiple tubes and now with need for another set  Past Medical History:  Diagnosis Date  . Aspiration pneumonia (HCC)   . Asthma   . Cerebral palsy, quadriplegic (HCC)   . Complication of anesthesia   . Constipation   . Family history of adverse reaction to anesthesia    Mother - Vomitting, Flu symptoms  . Hypertension   . Lactose intolerance   . Legally blind   . Pneumonia   . PONV (postoperative nausea and vomiting)    did not get sick with 07/13/14 OR- with medication and he woke up better.  . RSV (respiratory syncytial virus infection)   . Seizures (HCC)   . Vision abnormalities     Past Surgical History:  Procedure Laterality Date  . ADENOIDECTOMY    . CHOLECYSTECTOMY  08/2014  . GASTROSTOMY TUBE PLACEMENT     age 618 months- replaced - 05/2014  . hamstring resection Bilateral   . HIATAL HERNIA REPAIR    . MYRINGOTOMY  03/26/2011   Procedure: MYRINGOTOMY;  Surgeon: Leonette MostJohn M Dia Donate;  Location: [facility] OR;  Service: ENT;  Laterality: Bilateral;  BILATERAL MYRINGOTOMY   . MYRINGOTOMY WITH TUBE PLACEMENT Bilateral 01/06/2014   Procedure: MYRINGOTOMY WITH TUBE PLACEMENT;  Surgeon: Suzanna ObeyJohn Lerae Langham, MD;  [facility];  Service: ENT;  Laterality: Bilateral;  Bilateral myringotomy with tube placement and nasal endoscopy  . MYRINGOTOMY WITH TUBE PLACEMENT Bilateral 07/13/2014   Procedure: MYRINGOTOMY WITH TUBE PLACEMENT;  Surgeon: Suzanna ObeyJohn Lilia Letterman, MD;  [facility];  Service: ENT;  Laterality: Bilateral;  bilateral T Tubes  . MYRINGOTOMY WITH TUBE PLACEMENT Bilateral 04/20/2015   Procedure: MYRINGOTOMY WITH TUBE PLACEMENT;  Surgeon: Suzanna ObeyJohn Marijane Trower, MD;  [facility];  Service: ENT;  Laterality: Bilateral;  . MYRINGOTOMY WITH TUBE PLACEMENT Bilateral 10/15/2015   Procedure: MYRINGOTOMY WITH TUBE PLACEMENT;  Surgeon: Suzanna ObeyJohn Sydney Hasten, MD;  [facility];  Service: ENT;  Laterality: Bilateral;  sheehy tubes   . Nissan  Plundication      Family History  Problem Relation Age of Onset  . Asthma Mother   . Hypertension Mother   . Hypertension Father   . Asthma Sister   . Arthritis Maternal Grandmother   . Hypertension Maternal Grandmother   . Arthritis Maternal Grandfather   . Diabetes Maternal Grandfather   . Heart disease Maternal Grandfather   . Arthritis Paternal Grandmother   . COPD Paternal Grandmother    Social History:  reports that he has never smoked. He has never used smokeless tobacco. He reports that he does not drink alcohol or use drugs.  Allergies:  Allergies  Allergen Reactions  . Valproic Acid Other (See Comments)    Increased seizures Toxic at low doses  . Albuterol Other (See Comments)    "blood pressure and heart rate increases." Is able to take levalbuterol  . Amlodipine Hives  . Bioflavonoids Hives  . Citrus Hives  . Clonidine Hives and Rash  . Labetalol Palpitations, Rash and Other (See Comments)  . Lactose Other (See Comments)    Lactose Intolerance, GI Upset   . Lorazepam Other (See Comments)    Paradoxical reaction   . Mangifera Indica Hives    MANGO  . Morphine Hives and Nausea And Vomiting  . Ondansetron Hives  . Strawberry Extract Hives  . Vancomycin Other (See Comments)    RED MAN SYNDROME [not an allergy] Mom would rather  pt not have this medication due to reaction  . Carbamazepine Other (See Comments)    Toxic at low doses  . Isradipine Hives and Rash    Medications Prior to Admission  Medication Sig Dispense Refill  . acetaminophen (TYLENOL) 160 MG/5ML liquid Take 640 mg by mouth every 4 (four) hours as needed for pain.    . ciprofloxacin-dexamethasone (CIPRODEX) otic suspension Place 5 drops into both ears 2 (two) times daily.    . clonazePAM (KLONOPIN) 0.5 MG tablet 0.5 mg by PEG Tube route 4 (four) times daily. Times to take it at 4am, 10am, 3pm, 7pm     . diazepam (DIASTAT ACUDIAL) 10 MG GEL Place 15 mg rectally as needed for seizure.     .  diazepam (VALIUM) 1 MG/ML solution Place 15 mg into feeding tube 3 (three) times daily as needed (seizures. up to three times within 30 minutes).     Marland Kitchen glycopyrrolate (ROBINUL) 1 MG tablet Place 3 mg into feeding tube 2 (two) times daily. Dissolve in water and Give via G tube.    Marland Kitchen ibuprofen (ADVIL,MOTRIN) 100 MG/5ML suspension Take 400 mg by mouth every 4 (four) hours as needed for mild pain.    Marland Kitchen lactobacillus acidophilus (BACID) TABS tablet Place 1 tablet into feeding tube daily.     Marland Kitchen lamoTRIgine (LAMICTAL) 150 MG tablet Place 150 mg into feeding tube 2 (two) times daily. Dissolve in water and give via G tube.    . levalbuterol (XOPENEX) 0.63 MG/3ML nebulizer solution Take 3 mLs (0.63 mg total) by nebulization every 4 (four) hours as needed for wheezing or shortness of breath. (Patient taking differently: Take 0.63 mg by nebulization 3 (three) times daily. May inhale a 4th dose as needed for shortness of breath) 3 mL 12  . levETIRAcetam (KEPPRA) 100 MG/ML solution Place 800 mg into feeding tube 2 (two) times daily. Give at 6AM, 5:30pm    . levocetirizine (XYZAL) 5 MG tablet Place 5 mg into feeding tube daily.    . metoprolol tartrate (LOPRESSOR) 25 MG tablet Place 50-75 mg into feeding tube See admin instructions. Dissolve in water and give via G Tube. Take 75 mg every morning and 50 mg every evening    . pantoprazole sodium (PROTONIX) 40 mg/20 mL PACK Place 20 mg into feeding tube 2 (two) times daily.     . polyethylene glycol (MIRALAX / GLYCOLAX) packet Place 60 g into feeding tube daily as needed. (Patient taking differently: Use 2 tablespoons mixed with formula feed once daily) 14 each 0  . sucralfate (CARAFATE) 1 GM/10ML suspension Take 1 g by mouth 2 (two) times daily as needed (upset stomach).     . temazepam (RESTORIL) 30 MG capsule Place 30 mg into feeding tube at bedtime. Break open, Dissolve in water and give via G tube. Take at 7pm    . Tretinoin (RETIN-A EX) Apply 1 application  topically at bedtime.      No results found for this or any previous visit (from the past 48 hour(s)). No results found.  Review of Systems  Constitutional: Negative.   HENT: Positive for ear discharge and hearing loss.   Skin: Negative.     There were no vitals taken for this visit. Physical Exam  Constitutional: He appears well-nourished.  HENT:  Head: Atraumatic.  Nose: Nose normal.  Wheelchair bound and mental disability  Eyes: Conjunctivae are normal.  Neck: Neck supple.  Cardiovascular: Normal rate.  Respiratory: Effort normal.  GI: Soft.  Assessment/Plan CHronic otitis media- he has ongoing issues with fluid and drainage and needs tubes. Discussed with parents and ready to proceed  Suzanna Obey, MD 08/20/2017, 7:40 AM

## 2017-08-20 NOTE — Anesthesia Postprocedure Evaluation (Signed)
Anesthesia Post Note  Patient: Christopher Caldwell  Procedure(s) Performed: BILATERAL MYRINGOTOMY WITH TUBE PLACEMENT (Bilateral Ear)     Patient location during evaluation: PACU Anesthesia Type: General Level of consciousness: awake and alert Pain management: pain level controlled Vital Signs Assessment: post-procedure vital signs reviewed and stable Respiratory status: spontaneous breathing, nonlabored ventilation, respiratory function stable and patient connected to nasal cannula oxygen Cardiovascular status: blood pressure returned to baseline and stable Postop Assessment: no apparent nausea or vomiting Anesthetic complications: no    Last Vitals:  Vitals:   08/20/17 1100 08/20/17 1119  BP: (!) 127/92 123/88  Pulse: 63 67  Resp: 19 18  Temp: 36.5 C 36.5 C  SpO2: 98% 99%    Last Pain:  Vitals:   08/20/17 1100  TempSrc:   PainSc: 0-No pain                 Aryah Doering

## 2017-08-21 ENCOUNTER — Encounter (HOSPITAL_COMMUNITY): Payer: Self-pay | Admitting: Otolaryngology

## 2019-01-24 DIAGNOSIS — Z9622 Myringotomy tube(s) status: Secondary | ICD-10-CM | POA: Insufficient documentation

## 2019-01-24 DIAGNOSIS — H7322 Unspecified myringitis, left ear: Secondary | ICD-10-CM | POA: Insufficient documentation

## 2019-01-24 DIAGNOSIS — H7291 Unspecified perforation of tympanic membrane, right ear: Secondary | ICD-10-CM | POA: Insufficient documentation

## 2019-09-16 ENCOUNTER — Telehealth: Payer: Self-pay | Admitting: Hematology and Oncology

## 2019-09-16 ENCOUNTER — Encounter: Payer: Self-pay | Admitting: Hematology and Oncology

## 2019-09-16 NOTE — Telephone Encounter (Signed)
Received a new hem referral from Courtney Paris, NP for neutropenia. Christopher Caldwell has been scheduled to see Dr. Leonides Schanz on 6/3 at 2pm. Letter mailed to the pt.

## 2019-10-06 ENCOUNTER — Inpatient Hospital Stay: Payer: Medicaid Other | Attending: Hematology and Oncology | Admitting: Hematology and Oncology

## 2019-10-06 ENCOUNTER — Other Ambulatory Visit: Payer: Self-pay

## 2019-10-06 ENCOUNTER — Inpatient Hospital Stay: Payer: Medicaid Other

## 2019-10-06 VITALS — BP 132/84 | HR 85 | Temp 98.6°F | Resp 22

## 2019-10-06 DIAGNOSIS — H6690 Otitis media, unspecified, unspecified ear: Secondary | ICD-10-CM

## 2019-10-06 DIAGNOSIS — D508 Other iron deficiency anemias: Secondary | ICD-10-CM

## 2019-10-06 DIAGNOSIS — E611 Iron deficiency: Secondary | ICD-10-CM

## 2019-10-06 DIAGNOSIS — G808 Other cerebral palsy: Secondary | ICD-10-CM

## 2019-10-06 DIAGNOSIS — R569 Unspecified convulsions: Secondary | ICD-10-CM

## 2019-10-06 DIAGNOSIS — H548 Legal blindness, as defined in USA: Secondary | ICD-10-CM | POA: Diagnosis not present

## 2019-10-06 DIAGNOSIS — D702 Other drug-induced agranulocytosis: Secondary | ICD-10-CM | POA: Diagnosis not present

## 2019-10-06 DIAGNOSIS — J690 Pneumonitis due to inhalation of food and vomit: Secondary | ICD-10-CM | POA: Diagnosis not present

## 2019-10-06 LAB — CBC WITH DIFFERENTIAL (CANCER CENTER ONLY)
Abs Immature Granulocytes: 0.02 10*3/uL (ref 0.00–0.07)
Basophils Absolute: 0 10*3/uL (ref 0.0–0.1)
Basophils Relative: 1 %
Eosinophils Absolute: 0.5 10*3/uL (ref 0.0–0.5)
Eosinophils Relative: 8 %
HCT: 49 % (ref 39.0–52.0)
Hemoglobin: 16.3 g/dL (ref 13.0–17.0)
Immature Granulocytes: 0 %
Lymphocytes Relative: 43 %
Lymphs Abs: 2.4 10*3/uL (ref 0.7–4.0)
MCH: 30.5 pg (ref 26.0–34.0)
MCHC: 33.3 g/dL (ref 30.0–36.0)
MCV: 91.8 fL (ref 80.0–100.0)
Monocytes Absolute: 0.5 10*3/uL (ref 0.1–1.0)
Monocytes Relative: 10 %
Neutro Abs: 2.1 10*3/uL (ref 1.7–7.7)
Neutrophils Relative %: 38 %
Platelet Count: 203 10*3/uL (ref 150–400)
RBC: 5.34 MIL/uL (ref 4.22–5.81)
RDW: 12.4 % (ref 11.5–15.5)
WBC Count: 5.6 10*3/uL (ref 4.0–10.5)
nRBC: 0 % (ref 0.0–0.2)

## 2019-10-06 LAB — RETIC PANEL
Immature Retic Fract: 4.7 % (ref 2.3–15.9)
RBC.: 5.44 MIL/uL (ref 4.22–5.81)
Retic Count, Absolute: 55.5 10*3/uL (ref 19.0–186.0)
Retic Ct Pct: 1 % (ref 0.4–3.1)
Reticulocyte Hemoglobin: 35.1 pg (ref >27.9)

## 2019-10-06 LAB — CMP (CANCER CENTER ONLY)
ALT: 45 U/L — ABNORMAL HIGH (ref 0–44)
AST: 26 U/L (ref 15–41)
Albumin: 4.4 g/dL (ref 3.5–5.0)
Alkaline Phosphatase: 73 U/L (ref 38–126)
Anion gap: 12 (ref 5–15)
BUN: 15 mg/dL (ref 6–20)
CO2: 23 mmol/L (ref 22–32)
Calcium: 10 mg/dL (ref 8.9–10.3)
Chloride: 105 mmol/L (ref 98–111)
Creatinine: 0.85 mg/dL (ref 0.61–1.24)
GFR, Est AFR Am: >60 mL/min (ref >60)
GFR, Estimated: >60 mL/min (ref >60)
Glucose, Bld: 109 mg/dL — ABNORMAL HIGH (ref 70–99)
Potassium: 4.6 mmol/L (ref 3.5–5.1)
Sodium: 140 mmol/L (ref 135–145)
Total Bilirubin: 0.5 mg/dL (ref 0.3–1.2)
Total Protein: 7.8 g/dL (ref 6.5–8.1)

## 2019-10-06 LAB — LACTATE DEHYDROGENASE: LDH: 154 U/L (ref 98–192)

## 2019-10-06 LAB — SAVE SMEAR(SSMR), FOR PROVIDER SLIDE REVIEW

## 2019-10-06 LAB — FOLATE: Folate: 31.1 ng/mL (ref >5.9)

## 2019-10-06 LAB — VITAMIN B12: Vitamin B-12: 933 pg/mL — ABNORMAL HIGH (ref 180–914)

## 2019-10-06 NOTE — Progress Notes (Signed)
[facility] [phone]   [phone]  INITIAL CONSULT NOTE  Patient Care Team: Eliberto Ivory, MD as PCP - General (Pediatrics)  Hematological/Oncological History #Relative Neutropenia  CHIEF COMPLAINTS/PURPOSE OF CONSULTATION:  " Relative Neutropenia "  HISTORY OF PRESENTING ILLNESS:  Christopher Caldwell 24 y.o. male with medical history significant for cerebral palsy with quadriplegia, legal blindness, recurrent ear infections, and aspiration PNA who presents for evaluation of a relatively low neutrophil count.   On review of the previous records Christopher Caldwell is followed by neurology and a primary care provider. He has frequent labs checks and has recently had a trend of a relatively low neutrophil count, with the respect to the other cells in the differential. On 04/04/2019 Christopher Caldwell had a white blood cell count of 6.2, hemoglobin 16.1, MCV of 94, and platelet count of 211.  At that time his white blood cells consisted of 50.6% of the total cell count with lymphocytes being 37.6.  Subsequently on 07/18/2019 the patient was found to have white blood cell count of 6.8, hemoglobin 15.5, MCV of 96, platelet count of 213 with a relative neutrophil percentage of 32.4 and lymphocytes constituting 51.5.  Due to concern for this relative shift and the percentage of neutrophils the patient was referred to hematology for further evaluation management.  On exam today Christopher Caldwell is accompanied by his father who provides all of the necessary information.  The patient is blind and does not communicate.  The father reports that Christopher Caldwell has not had any recent issues with fevers, chills, sweats, nausea, vomiting or diarrhea.  He reports that the patient does get frequent ear infections, but this has been a problem since childhood.  He notes that he has not seen any overt signs of bleeding, bruising, or dark stools.  He notes that his seizures are currently under good control  and that with respiratory therapy the patient has not had any recent lung infections.  The father fortunately brought in the patient's nutritional supplementation which includes vitamin B12, copper, and iron.  Otherwise the father notes no other issues that of been outside of the normal for the health of his son.  Review of systems was answered by the father.  MEDICAL HISTORY:  Past Medical History:  Diagnosis Date  . Aspiration pneumonia (HCC)   . Asthma   . Cerebral palsy, quadriplegic (HCC)   . Complication of anesthesia   . Constipation   . Family history of adverse reaction to anesthesia    Mother - Vomitting, Flu symptoms  . Hypertension   . Lactose intolerance   . Legally blind   . Pneumonia   . PONV (postoperative nausea and vomiting)    did not get sick with 07/13/14 OR- with medication and he woke up better.  . RSV (respiratory syncytial virus infection)   . Seizures (HCC)   . Vision abnormalities     SURGICAL HISTORY: Past Surgical History:  Procedure Laterality Date  . ADENOIDECTOMY    . CHOLECYSTECTOMY  08/2014  . GASTROSTOMY TUBE PLACEMENT     age 36 months- replaced - 05/2014  . hamstring resection Bilateral   . HIATAL HERNIA REPAIR    . MYRINGOTOMY  03/26/2011   Procedure: MYRINGOTOMY;  Surgeon: Leonette Most;  Location: [facility] OR;  Service: ENT;  Laterality: Bilateral;  BILATERAL MYRINGOTOMY   . MYRINGOTOMY WITH TUBE PLACEMENT Bilateral 01/06/2014   Procedure: MYRINGOTOMY WITH TUBE PLACEMENT;  Surgeon: Suzanna Obey, MD;  Location: [facility] OR;  Service: ENT;  Laterality: Bilateral;  Bilateral myringotomy with tube placement and nasal endoscopy  . MYRINGOTOMY WITH TUBE PLACEMENT Bilateral 07/13/2014   Procedure: MYRINGOTOMY WITH TUBE PLACEMENT;  Surgeon: Melissa Montane, MD;  Location: [facility];  Service: ENT;  Laterality: Bilateral;  bilateral T Tubes  . MYRINGOTOMY WITH TUBE PLACEMENT Bilateral 04/20/2015   Procedure: MYRINGOTOMY WITH TUBE PLACEMENT;  Surgeon: Melissa Montane, MD;  Location:  [facility];  Service: ENT;  Laterality: Bilateral;  . MYRINGOTOMY WITH TUBE PLACEMENT Bilateral 10/15/2015   Procedure: MYRINGOTOMY WITH TUBE PLACEMENT;  Surgeon: Melissa Montane, MD;  Location: [facility];  Service: ENT;  Laterality: Bilateral;  sheehy tubes   . MYRINGOTOMY WITH TUBE PLACEMENT Bilateral 08/20/2017   Procedure: BILATERAL MYRINGOTOMY WITH TUBE PLACEMENT;  Surgeon: Melissa Montane, MD;  Location: [facility];  Service: ENT;  Laterality: Bilateral;  . [employer] Plundication      SOCIAL HISTORY: Social History   Socioeconomic History  . Marital status: Single    Spouse name: Not on file  . Number of children: Not on file  . Years of education: Not on file  . Highest education level: Not on file  Occupational History  . Not on file  Tobacco Use  . Smoking status: Never Smoker  . Smokeless tobacco: Never Used  Substance and Sexual Activity  . Alcohol use: No  . Drug use: No  . Sexual activity: Not on file  Other Topics Concern  . Not on file  Social History Narrative  . Not on file   Social Determinants of Health   Financial Resource Strain:   . Difficulty of Paying Living Expenses:   Food Insecurity:   . Worried About Charity fundraiser in the Last Year:   . Arboriculturist in the Last Year:   Transportation Needs:   . Film/video editor (Medical):   Marland Kitchen Lack of Transportation (Non-Medical):   Physical Activity:   . Days of Exercise per Week:   . Minutes of Exercise per Session:   Stress:   . Feeling of Stress :   Social Connections:   . Frequency of Communication with Friends and Family:   . Frequency of Social Gatherings with Friends and Family:   . Attends Religious Services:   . Active Member of Clubs or Organizations:   . Attends Archivist Meetings:   Marland Kitchen Marital Status:   Intimate Partner Violence:   . Fear of Current or Ex-Partner:   . Emotionally Abused:   Marland Kitchen Physically Abused:   . Sexually Abused:     FAMILY HISTORY: Family History  Problem Relation  Age of Onset  . Asthma Mother   . Hypertension Mother   . Hypertension Father   . Asthma Sister   . Arthritis Maternal Grandmother   . Hypertension Maternal Grandmother   . Arthritis Maternal Grandfather   . Diabetes Maternal Grandfather   . Heart disease Maternal Grandfather   . Arthritis Paternal Grandmother   . COPD Paternal Grandmother     ALLERGIES:  is allergic to valproic acid; albuterol; amlodipine; bioflavonoids; citrus; clonidine; labetalol; lactose; lorazepam; mangifera indica; morphine; ondansetron; strawberry extract; vancomycin; carbamazepine; and isradipine.  MEDICATIONS:  Current Outpatient Medications  Medication Sig Dispense Refill  . acetaminophen (TYLENOL) 160 MG/5ML liquid Take 640 mg by mouth every 4 (four) hours as needed for pain.    . ciprofloxacin-dexamethasone (CIPRODEX) otic suspension Place 5 drops into both ears 2 (two) times daily.    . clonazePAM (KLONOPIN) 0.5 MG  tablet 0.5 mg by PEG Tube route 4 (four) times daily. Times to take it at 4am, 10am, 3pm, 7pm     . diazepam (DIASTAT ACUDIAL) 10 MG GEL Place 15 mg rectally as needed for seizure.     . diazepam (VALIUM) 1 MG/ML solution Place 15 mg into feeding tube 3 (three) times daily as needed (seizures. up to three times within 30 minutes).     Marland Kitchen glycopyrrolate (ROBINUL) 1 MG tablet Place 3 mg into feeding tube 2 (two) times daily. Dissolve in water and Give via G tube.    Marland Kitchen ibuprofen (ADVIL,MOTRIN) 100 MG/5ML suspension Take 400 mg by mouth every 4 (four) hours as needed for mild pain.    Marland Kitchen lactobacillus acidophilus (BACID) TABS tablet Place 1 tablet into feeding tube daily.     Marland Kitchen lamoTRIgine (LAMICTAL) 150 MG tablet Place 150 mg into feeding tube 2 (two) times daily. Dissolve in water and give via G tube.    . levalbuterol (XOPENEX) 0.63 MG/3ML nebulizer solution Take 3 mLs (0.63 mg total) by nebulization every 4 (four) hours as needed for wheezing or shortness of breath. (Patient taking differently: Take  0.63 mg by nebulization 3 (three) times daily. May inhale a 4th dose as needed for shortness of breath) 3 mL 12  . levETIRAcetam (KEPPRA) 100 MG/ML solution Place 800 mg into feeding tube 2 (two) times daily. Give at 6AM, 5:30pm    . levocetirizine (XYZAL) 5 MG tablet Place 5 mg into feeding tube daily.    . metoprolol tartrate (LOPRESSOR) 25 MG tablet Place 50-75 mg into feeding tube See admin instructions. Dissolve in water and give via G Tube. Take 75 mg every morning and 50 mg every evening    . pantoprazole sodium (PROTONIX) 40 mg/20 mL PACK Place 20 mg into feeding tube 2 (two) times daily.     . polyethylene glycol (MIRALAX / GLYCOLAX) packet Place 60 g into feeding tube daily as needed. (Patient taking differently: Use 2 tablespoons mixed with formula feed once daily) 14 each 0  . sucralfate (CARAFATE) 1 GM/10ML suspension Take 1 g by mouth 2 (two) times daily as needed (upset stomach).     . temazepam (RESTORIL) 30 MG capsule Place 30 mg into feeding tube at bedtime. Break open, Dissolve in water and give via G tube. Take at 7pm    . Tretinoin (RETIN-A EX) Apply 1 application topically at bedtime.     No current facility-administered medications for this visit.    REVIEW OF SYSTEMS:   Constitutional: ( - ) fevers, ( - )  chills , ( - ) night sweats Respiratory: ( - ) cough, ( - ) dyspnea, ( - ) wheezes Cardiovascular:( - ) lower extremity swelling  Gastrointestinal:  ( - ) change in bowel habits Skin: ( - ) abnormal skin rashes Lymphatics: ( - ) new lymphadenopathy, ( - ) easy bruising All other systems were reviewed with the patient and are negative.  PHYSICAL EXAMINATION: ECOG PERFORMANCE STATUS: 4 - Bedbound  Vitals:   10/06/19 1414  BP: 132/84  Pulse: 85  Resp: (!) 22  Temp: 98.6 F (37 C)  SpO2: 98%   There were no vitals filed for this visit.  GENERAL: small Caucasian male with cerebral palsy in NAD. Physically active, eyes open.  SKIN: skin color, texture, turgor  are normal, no rashes or significant lesions EYES: conjunctiva are pink and non-injected, sclera clear LUNGS: clear to auscultation and percussion with normal breathing effort HEART: regular  rate & rhythm and no murmurs and no lower extremity edema Musculoskeletal: no cyanosis of digits and no clubbing  PSYCH: alert & oriented x 3, fluent speech NEURO: no focal motor/sensory deficits  LABORATORY DATA:  I have reviewed the data as listed CBC Latest Ref Rng & Units 03/11/2016 08/09/2015 01/30/2015  WBC 4.0 - 10.5 K/uL 9.7 8.8 7.0  Hemoglobin 13.0 - 17.0 g/dL 17.8(H) 16.4 16.3(H)  Hematocrit 39.0 - 52.0 % 51.3 48.2 47.9  Platelets 150 - 400 K/uL 255 210 227    CMP Latest Ref Rng & Units 03/11/2016 08/09/2015 01/30/2015  Glucose 65 - 99 mg/dL 161(W102(H) 960(A129(H) 540(J103(H)  BUN 6 - 20 mg/dL 15 12 20   Creatinine 0.61 - 1.24 mg/dL 8.110.73 9.140.70 7.820.70  Sodium 135 - 145 mmol/L 141 139 140  Potassium 3.5 - 5.1 mmol/L 4.0 3.9 4.0  Chloride 101 - 111 mmol/L 104 102 104  CO2 22 - 32 mmol/L 25 24 29   Calcium 8.9 - 10.3 mg/dL 95.610.1 21.310.0 9.8  Total Protein 6.5 - 8.1 g/dL - - 8.5(H)  Total Bilirubin 0.3 - 1.2 mg/dL - - 0.4  Alkaline Phos 52 - 171 U/L - - 100  AST 15 - 41 U/L - - 34  ALT 17 - 63 U/L - - 51    BLOOD FILM: Review of the peripheral blood smear showed normal appearing white cells with neutrophils that were appropriately lobated and granulated, though modestly low in number relative to lymphocytes. There was no predominance of bi-lobed or hyper-segmented neutrophils appreciated. No Dohle bodies were noted. There was no left shifting, immature forms or blasts noted. Lymphocytes remain normal in size without any predominance of large granular lymphocytes. Red cells show no anisopoikilocytosis, macrocytes , microcytes or polychromasia. There were no schistocytes, target cells, echinocytes, acanthocytes, dacrocytes, or stomatocytes.There was no rouleaux formation, nucleated red cells, or intra-cellular inclusions  noted. The platelets are normal in size, shape, and color without any clumping evident.  RADIOGRAPHIC STUDIES: No results found.  ASSESSMENT & PLAN Christopher CollaRyan K Caldwell 24 y.o. male with medical history significant for cerebral palsy with quadriplegia, legal blindness, recurrent ear infections, and aspiration PNA who presents for evaluation of a relatively low neutrophil count.  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  Additionally while reviewing the labs it is apparent the patient has what appears to be an iron deficiency without anemia.  Because of the patient's relative neutropenia could have multiple possible etiologies including medication side effect, nutritional deficiency, or bone marrow dysfunction.  We will perform a full nutritional evaluation today to assure that there are no nutrients which may be causing the patient to have low neutrophils.  The ratio of neutrophils to the lymphocytes is off and is most likely secondary to medication side effect with the patient's Keppra.  This effect may be more pronounced when the Keppra levels are elevated above the therapeutic range.  Overall the patient has an ANC greater than 1.5 which would imply that he is not immune to compromised due to this relative decrease in neutrophils.  Additionally he has no other hematological abnormalities and review of peripheral blood film shows no concerning abnormal cells.  Given this I think we should continue to monitor.  There is no need for further intervention or change in his seizure medications at this time.  In terms of his iron deficiency the etiology  could potentially be occult GI blood loss versus poor absorption.  The patient receives all of his nutrition via tube feeds and while his supplementation does include a small amount of iron it is possible he is  not absorbing this well.  I would recommend that we trial liquid iron and attempt to raise his levels.  In the event that the patient were to develop clear signs of GI bleeding or does not respond to liquid iron therapy I would recommend consideration of GI evaluation.  Overall at this time I do not think the patient requires routine follow-up in the hematology clinic.  In the event he were to develop worsening cytopenias or other concerning symptoms we are happy to see him back in clinic.  #Relative Neutropenia --patient had relatively low neutrophils relative to lymphocytes on the differential in the course of his last few labs.  --on labs today his ANC is 2100, which is not neutropenic. It is however lower than his lymphocyte count.  --findings are most consistent with relative neutropenia 2/2 to medication side effect. Seizure medication are often implicated as the cause, particularly levetiracetam (Keppra (Levetiracetam) Package Insert). --no intervention requires as long as ANC remains >1500. Can re-refer if these drop. No need to change his seizure medications at this time. --will r/o other possible etiologies including nutritional deficiencies. Additionally review of peripheral blood film shows no abnormalities in any cell line.  --have patient's neurologist/PCP continue to monitor the neutrophil count.   #Iron Deficiency without Anemia --outside labs showed a markedly elevated TIBC and low ferritin. These findings are most consistent with iron deficiency --will order full iron panel, reticulocyte panel and ferritin level to confirm. --if iron deficient, patient will require ferrous sulfate solution as all his nutrition is via PEG tube --source of iron deficiency is not clear. Father dies any over sign of bleeding. Possible poor absorption vs occult GI bleeding --if iron levels fail to improve with supplementation consider GI evaluation.  --recommend this continued to be followed by PCP.     Orders Placed This Encounter  Procedures  . CBC with Differential (Cancer Center Only)    Standing Status:   Future    Standing Expiration Date:   10/05/2020  . CMP (Cancer Center only)    Standing Status:   Future    Standing Expiration Date:   10/05/2020  . Lactate dehydrogenase (LDH)    Standing Status:   Future    Standing Expiration Date:   10/05/2020  . Save Smear (SSMR)    Standing Status:   Future    Standing Expiration Date:   10/05/2020  . Copper, serum    Standing Status:   Future    Standing Expiration Date:   10/05/2020  . Vitamin B12    Standing Status:   Future    Standing Expiration Date:   10/05/2020  . Folate, Serum    Standing Status:   Future    Standing Expiration Date:   10/05/2020    All questions were answered. The patient knows to call the clinic with any problems, questions or concerns.  A total of more than 60 minutes were spent on this encounter and over half of that time was spent on counseling and coordination of care as outlined above.   Ulysees Barns, MD Department of Hematology/Oncology [facility] Phone: [phone] Pager: [phone] Email: [email or web address]  10/06/2019 2:38 PM  Literature Support:  Keppra (Levetiracetam) Package Insert  --A total of  3.2% of KEPPRA-treated and 1.8% of placebo-treated patients had at least one possibly significant ( < 2.8 x 109 /L) decreased WBC, and 2.4% of KEPPRA-treated and 1.4% of placebo-treated patients had at least one possibly significant (< 1.0 x 109/L) decreased neutrophil count. Of the KEPPRA-treated patients with a low neutrophil count, all but one rose towards or to baseline with continued treatment. No patient was discontinued secondary to low neutrophil counts. In pediatrics, statistically significant decreases in WBC and neutrophil counts were seen in KEPPRA-treated patients as compared to placebo.

## 2019-10-07 ENCOUNTER — Telehealth: Payer: Self-pay | Admitting: *Deleted

## 2019-10-07 LAB — IRON AND TIBC
Iron: 77 ug/dL (ref 42–163)
Saturation Ratios: 17 % — ABNORMAL LOW (ref 20–55)
TIBC: 443 ug/dL — ABNORMAL HIGH (ref 202–409)
UIBC: 366 ug/dL (ref 117–376)

## 2019-10-07 LAB — FERRITIN: Ferritin: 18 ng/mL — ABNORMAL LOW (ref 24–336)

## 2019-10-07 MED ORDER — FERROUS SULFATE 220 (44 FE) MG/5ML PO SOLN: 5.0000 mL | Freq: Every day | ORAL | 1 refills | Status: DC

## 2019-10-07 NOTE — Telephone Encounter (Signed)
TCT pt's father, Lyle Niblett.  Spoke with him and advised that his son does have iron deficiency anemia.  Advised that Dr. Leonides Schanz will send in a prescription to his pharmacy. Onalee Hua states they use [facility]. Also advised that the low neutrophil count is likely due to his seizure medications but there was no reason to change those medications.  Advised that his son can be followed by his PCP at this point.  Onalee Hua voiced understanding.

## 2019-10-07 NOTE — Telephone Encounter (Signed)
-----   Message from Christopher Standard, MD sent at 10/07/2019 10:58 AM EDT ----- Please let [name] know that his son does have iron deficiency anemia. We can call in liquid iron to administer via G tube to a pharmacy of his choosing.  Additionally his labs shows near normal White Blood cell counts. We could not find any reason for the relatively low neutrophils, findings are most likely caused by his seizure medications. These level are not low enough to cause any concern, he does not need to change his medications.  Christopher Caldwell  ----- Message ----- From: Christopher Caldwell, [facility] [place] Sent: 10/06/2019   5:05 PM EDT To: Christopher Standard, MD

## 2019-10-08 ENCOUNTER — Encounter: Payer: Self-pay | Admitting: Hematology and Oncology

## 2019-10-08 LAB — COPPER, SERUM: Copper: 103 ug/dL (ref 63–121)

## 2020-01-10 ENCOUNTER — Other Ambulatory Visit (HOSPITAL_COMMUNITY): Payer: Self-pay | Admitting: Nurse Practitioner

## 2020-01-10 DIAGNOSIS — U071 COVID-19: Secondary | ICD-10-CM

## 2020-01-10 NOTE — Progress Notes (Signed)
I connected by phone with [name], Christopher Caldwell's father and legal guardian, on 01/10/2020 at 5:01 PM to discuss the potential use of an new treatment for mild to moderate COVID-19 viral infection in non-hospitalized patients.  This patient is a 24 y.o. male that meets the FDA criteria for Emergency Use Authorization of casirivimab\imdevimab.  Has a (+) direct SARS-CoV-2 viral test result  Has mild or moderate COVID-19   Is ? 24 years of age and weighs ? 40 kg  Is NOT hospitalized due to COVID-19  Is NOT requiring oxygen therapy or requiring an increase in baseline oxygen flow rate due to COVID-19  Is within 10 days of symptom onset  Has at least one of the high risk factor(s) for progression to severe COVID-19 and/or hospitalization as defined in EUA.  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    I have spoken and communicated the following to the patient or parent/caregiver:  1. FDA has authorized the emergency use of casirivimab\imdevimab for the treatment of mild to moderate COVID-19 in adults and pediatric patients with positive results of direct SARS-CoV-2 viral testing who are 37 years of age and older weighing at least 40 kg, and who are at high risk for progressing to severe COVID-19 and/or hospitalization.  2. The significant known and potential risks and benefits of casirivimab\imdevimab, and the extent to which such potential risks and benefits are unknown.  3. Information on available alternative treatments and the risks and benefits of those alternatives, including clinical trials.  4. Patients treated with casirivimab\imdevimab should continue to self-isolate and use infection control measures (e.g., wear mask, isolate, social distance, avoid sharing personal items, clean and disinfect "high touch" surfaces, and frequent handwashing) according to CDC guidelines.   5. The patient or parent/caregiver has the option to accept or refuse  casirivimab\imdevimab .  After reviewing this information with the patient, The patient agreed to proceed with receiving casirivimab\imdevimab infusion and will be provided a copy of the Fact sheet prior to receiving the infusion.Consuello Masse, DNP, AGNP-C [phone] (Infusion Center Hotline)

## 2020-01-11 ENCOUNTER — Other Ambulatory Visit (HOSPITAL_COMMUNITY): Payer: Self-pay

## 2020-01-11 ENCOUNTER — Ambulatory Visit (HOSPITAL_COMMUNITY)
Admission: RE | Admit: 2020-01-11 | Discharge: 2020-01-11 | Disposition: A | Discharge status: Home / Self Care | Payer: Medicaid Other | Source: Ambulatory Visit | Attending: Pulmonary Disease | Admitting: Pulmonary Disease

## 2020-01-11 DIAGNOSIS — U071 COVID-19: Secondary | ICD-10-CM | POA: Insufficient documentation

## 2020-01-11 MED ORDER — METHYLPREDNISOLONE SODIUM SUCC 125 MG IJ SOLR: 125.0000 mg | Freq: Once | INTRAMUSCULAR | Status: DC | PRN

## 2020-01-11 MED ORDER — EPINEPHRINE 0.3 MG/0.3ML IJ SOAJ: 0.3000 mg | Freq: Once | INTRAMUSCULAR | Status: DC | PRN

## 2020-01-11 MED ORDER — FAMOTIDINE IN NACL 20-0.9 MG/50ML-% IV SOLN: 20.0000 mg | Freq: Once | INTRAVENOUS | Status: DC | PRN

## 2020-01-11 MED ORDER — SODIUM CHLORIDE 0.9 % IV SOLN: INTRAVENOUS | Status: DC | PRN

## 2020-01-11 MED ORDER — SODIUM CHLORIDE 0.9 % IV SOLN
1200.0000 mg | Freq: Once | INTRAVENOUS | Status: AC
  Administered 2020-01-11: 1200 mg via INTRAVENOUS
  Filled 2020-01-11: qty 10

## 2020-01-11 MED ORDER — ALBUTEROL SULFATE HFA 108 (90 BASE) MCG/ACT IN AERS: 2.0000 | INHALATION_SPRAY | Freq: Once | RESPIRATORY_TRACT | Status: DC | PRN

## 2020-01-11 MED ORDER — DIPHENHYDRAMINE HCL 50 MG/ML IJ SOLN: 50.0000 mg | Freq: Once | INTRAMUSCULAR | Status: DC | PRN

## 2020-01-11 NOTE — Progress Notes (Signed)
Mr. Petersen completed his infusion without complication. During the observation period he was noted to have oxygen saturations around 88%. Nursing suctioned him and placed 2L of oxygen via nasal cannula. Per father he use PRN oxygen on occasion primarily if he were having seizure. O2 saturations improved to 91%.  Lung auscultation was clear bilaterally. Continued to have cough.  Discharged without complication and instructions to monitor oxygen levels as previously performed.   Marcos Eke, NP 01/11/2020 3:26 PM

## 2020-01-11 NOTE — Progress Notes (Signed)
  Diagnosis: COVID-19 ° °Physician:  Dr. Patrick Wright ° °Procedure: [facility] Med: casirivimab\imdevimab infusion - Provided patient with casirivimab\imdevimab fact sheet for patients, parents and caregivers prior to infusion. ° °Complications: No immediate complications noted. ° °Discharge: Discharged home  ° °Palyn Scrima J °01/11/2020 ° °

## 2020-01-11 NOTE — Discharge Instructions (Signed)
10 Things You Can Do to Manage Your COVID-19 Symptoms at Home If you have possible or confirmed COVID-19: 1. Stay home from work and school. And stay away from other public places. If you must go out, avoid using any kind of public transportation, ridesharing, or taxis. 2. Monitor your symptoms carefully. If your symptoms get worse, call your healthcare provider immediately. 3. Get rest and stay hydrated. 4. If you have a medical appointment, call the healthcare provider ahead of time and tell them that you have or may have COVID-19. 5. For medical emergencies, call 911 and notify the dispatch personnel that you have or may have COVID-19. 6. Cover your cough and sneezes with a tissue or use the inside of your elbow. 7. Wash your hands often with soap and water for at least 20 seconds or clean your hands with an alcohol-based hand sanitizer that contains at least 60% alcohol. 8. As much as possible, stay in a specific room and away from other people in your home. Also, you should use a separate bathroom, if available. If you need to be around other people in or outside of the home, wear a mask. 9. Avoid sharing personal items with other people in your household, like dishes, towels, and bedding. 10. Clean all surfaces that are touched often, like counters, tabletops, and doorknobs. Use household cleaning sprays or wipes according to the label instructions. cdc.gov/coronavirus 11/03/2018 This information is not intended to replace advice given to you by your health care provider. Make sure you discuss any questions you have with your health care provider. Document Revised: 04/07/2019 Document Reviewed: 04/07/2019 Elsevier Patient Education  2020 [employer]  

## 2020-04-23 ENCOUNTER — Other Ambulatory Visit: Payer: Self-pay | Admitting: Hematology and Oncology

## 2020-08-14 DIAGNOSIS — H9212 Otorrhea, left ear: Secondary | ICD-10-CM | POA: Insufficient documentation

## 2020-11-15 DIAGNOSIS — H6992 Unspecified Eustachian tube disorder, left ear: Secondary | ICD-10-CM | POA: Insufficient documentation

## 2022-03-06 ENCOUNTER — Other Ambulatory Visit: Payer: Self-pay | Admitting: Otolaryngology

## 2022-03-18 ENCOUNTER — Other Ambulatory Visit: Payer: Self-pay | Admitting: Otolaryngology

## 2022-04-08 ENCOUNTER — Encounter (HOSPITAL_COMMUNITY): Payer: Self-pay | Admitting: Otolaryngology

## 2022-04-08 ENCOUNTER — Other Ambulatory Visit: Payer: Self-pay

## 2022-04-08 NOTE — Progress Notes (Signed)
Spoke with pt's mother Christopher Caldwell for pre-op call. Pt has cerebral palsy, non verbal and total care. Pt has a peg tube. No cardiac history noted, pt is treated for HTN.   Pt is not diabetic.   Mom states pt's caregiver is supposed to bathe him today. He will need CHG wipes on arrival.

## 2022-04-09 ENCOUNTER — Ambulatory Visit (HOSPITAL_COMMUNITY)
Admission: RE | Admit: 2022-04-09 | Discharge: 2022-04-09 | Disposition: A | Discharge status: Home / Self Care | Payer: Medicaid Other | Attending: Otolaryngology | Admitting: Otolaryngology

## 2022-04-09 ENCOUNTER — Other Ambulatory Visit: Payer: Self-pay

## 2022-04-09 ENCOUNTER — Encounter (HOSPITAL_COMMUNITY): Payer: Self-pay | Admitting: Otolaryngology

## 2022-04-09 ENCOUNTER — Encounter (HOSPITAL_COMMUNITY)
Admission: RE | Disposition: A | Discharge status: Home / Self Care | Payer: Self-pay | Source: Home / Self Care | Attending: Otolaryngology

## 2022-04-09 ENCOUNTER — Ambulatory Visit (HOSPITAL_BASED_OUTPATIENT_CLINIC_OR_DEPARTMENT_OTHER): Payer: Medicaid Other | Admitting: Certified Registered"

## 2022-04-09 ENCOUNTER — Ambulatory Visit (HOSPITAL_COMMUNITY): Payer: Medicaid Other | Admitting: Certified Registered"

## 2022-04-09 DIAGNOSIS — G8250 Quadriplegia, unspecified: Secondary | ICD-10-CM | POA: Insufficient documentation

## 2022-04-09 DIAGNOSIS — Z79899 Other long term (current) drug therapy: Secondary | ICD-10-CM | POA: Diagnosis not present

## 2022-04-09 DIAGNOSIS — H6992 Unspecified Eustachian tube disorder, left ear: Secondary | ICD-10-CM

## 2022-04-09 DIAGNOSIS — H7291 Unspecified perforation of tympanic membrane, right ear: Secondary | ICD-10-CM | POA: Insufficient documentation

## 2022-04-09 DIAGNOSIS — K219 Gastro-esophageal reflux disease without esophagitis: Secondary | ICD-10-CM | POA: Insufficient documentation

## 2022-04-09 DIAGNOSIS — G808 Other cerebral palsy: Secondary | ICD-10-CM | POA: Insufficient documentation

## 2022-04-09 DIAGNOSIS — H6990 Unspecified Eustachian tube disorder, unspecified ear: Secondary | ICD-10-CM | POA: Insufficient documentation

## 2022-04-09 DIAGNOSIS — H9212 Otorrhea, left ear: Secondary | ICD-10-CM | POA: Insufficient documentation

## 2022-04-09 DIAGNOSIS — I10 Essential (primary) hypertension: Secondary | ICD-10-CM

## 2022-04-09 HISTORY — DX: Gastro-esophageal reflux disease without esophagitis: K21.9

## 2022-04-09 HISTORY — PX: MYRINGOTOMY WITH TUBE PLACEMENT: SHX5663

## 2022-04-09 LAB — BASIC METABOLIC PANEL
Anion gap: 8 (ref 5–15)
BUN: 19 mg/dL (ref 6–20)
CO2: 27 mmol/L (ref 22–32)
Calcium: 9.4 mg/dL (ref 8.9–10.3)
Chloride: 101 mmol/L (ref 98–111)
Creatinine, Ser: 0.87 mg/dL (ref 0.61–1.24)
GFR, Estimated: >60 mL/min (ref >60)
Glucose, Bld: 80 mg/dL (ref 70–99)
Potassium: 3.9 mmol/L (ref 3.5–5.1)
Sodium: 136 mmol/L (ref 135–145)

## 2022-04-09 LAB — CBC
HCT: 44.1 % (ref 39.0–52.0)
Hemoglobin: 15.2 g/dL (ref 13.0–17.0)
MCH: 31.4 pg (ref 26.0–34.0)
MCHC: 34.5 g/dL (ref 30.0–36.0)
MCV: 91.1 fL (ref 80.0–100.0)
Platelets: 191 10*3/uL (ref 150–400)
RBC: 4.84 MIL/uL (ref 4.22–5.81)
RDW: 12.4 % (ref 11.5–15.5)
WBC: 6.3 10*3/uL (ref 4.0–10.5)
nRBC: 0 % (ref 0.0–0.2)

## 2022-04-09 SURGERY — MYRINGOTOMY WITH TUBE PLACEMENT
Anesthesia: General | Site: Ear | Laterality: Left

## 2022-04-09 MED ORDER — PROPOFOL 1000 MG/100ML IV EMUL
INTRAVENOUS | Status: AC
  Filled 2022-04-09: qty 100

## 2022-04-09 MED ORDER — CHLORHEXIDINE GLUCONATE 0.12 % MT SOLN
15.0000 mL | Freq: Once | OROMUCOSAL | Status: DC
  Filled 2022-04-09: qty 15

## 2022-04-09 MED ORDER — SUCCINYLCHOLINE CHLORIDE 200 MG/10ML IV SOSY
PREFILLED_SYRINGE | INTRAVENOUS | Status: AC
  Filled 2022-04-09: qty 10

## 2022-04-09 MED ORDER — CIPROFLOXACIN-DEXAMETHASONE 0.3-0.1 % OT SUSP
OTIC | Status: AC
  Filled 2022-04-09: qty 7.5

## 2022-04-09 MED ORDER — KETOROLAC TROMETHAMINE 30 MG/ML IJ SOLN
INTRAMUSCULAR | Status: DC | PRN
  Administered 2022-04-09: 30 mg via INTRAVENOUS

## 2022-04-09 MED ORDER — PROPOFOL 10 MG/ML IV BOLUS
INTRAVENOUS | Status: AC
  Filled 2022-04-09: qty 20

## 2022-04-09 MED ORDER — MIDAZOLAM HCL 2 MG/2ML IJ SOLN
INTRAMUSCULAR | Status: AC
  Filled 2022-04-09: qty 2

## 2022-04-09 MED ORDER — CIPROFLOXACIN-DEXAMETHASONE 0.3-0.1 % OT SUSP: 4.0000 [drp] | Freq: Two times a day (BID) | OTIC | 0 refills | Status: AC

## 2022-04-09 MED ORDER — LACTATED RINGERS IV SOLN: INTRAVENOUS | Status: DC

## 2022-04-09 MED ORDER — ORAL CARE MOUTH RINSE: 15.0000 mL | Freq: Once | OROMUCOSAL | Status: DC

## 2022-04-09 MED ORDER — PROPOFOL 500 MG/50ML IV EMUL
INTRAVENOUS | Status: DC | PRN
  Administered 2022-04-09: 150 ug/kg/min via INTRAVENOUS

## 2022-04-09 MED ORDER — FENTANYL CITRATE (PF) 100 MCG/2ML IJ SOLN: 25.0000 ug | INTRAMUSCULAR | Status: DC | PRN

## 2022-04-09 MED ORDER — PROPOFOL 10 MG/ML IV BOLUS
INTRAVENOUS | Status: DC | PRN
  Administered 2022-04-09: 70 mg via INTRAVENOUS
  Administered 2022-04-09: 50 mg via INTRAVENOUS

## 2022-04-09 MED ORDER — LIDOCAINE 2% (20 MG/ML) 5 ML SYRINGE
INTRAMUSCULAR | Status: AC
  Filled 2022-04-09: qty 5

## 2022-04-09 MED ORDER — ACETAMINOPHEN 10 MG/ML IV SOLN
INTRAVENOUS | Status: AC
  Filled 2022-04-09: qty 100

## 2022-04-09 MED ORDER — CIPROFLOXACIN-DEXAMETHASONE 0.3-0.1 % OT SUSP
OTIC | Status: DC | PRN
  Administered 2022-04-09: 4 [drp]

## 2022-04-09 MED ORDER — CEFAZOLIN SODIUM-DEXTROSE 2-4 GM/100ML-% IV SOLN
2.0000 g | INTRAVENOUS | Status: DC
  Filled 2022-04-09: qty 100

## 2022-04-09 MED ORDER — ACETAMINOPHEN 10 MG/ML IV SOLN
INTRAVENOUS | Status: DC | PRN
  Administered 2022-04-09: 1000 mg via INTRAVENOUS

## 2022-04-09 MED ORDER — MIDAZOLAM HCL 2 MG/2ML IJ SOLN
INTRAMUSCULAR | Status: DC | PRN
  Administered 2022-04-09: 2 mg via INTRAVENOUS

## 2022-04-09 MED ORDER — LACTATED RINGERS IV SOLN: INTRAVENOUS | Status: DC | PRN

## 2022-04-09 MED ORDER — FENTANYL CITRATE (PF) 250 MCG/5ML IJ SOLN
INTRAMUSCULAR | Status: AC
  Filled 2022-04-09: qty 5

## 2022-04-09 SURGICAL SUPPLY — 21 items
ASPIRATOR COLLECTOR MID EAR (MISCELLANEOUS) IMPLANT
BLADE MYRINGOTOMY 6 SPEAR HDL (BLADE) ×1 IMPLANT
BLADE SURG 15 STRL LF DISP TIS (BLADE) IMPLANT
BLADE SURG 15 STRL SS (BLADE)
CANISTER SUCT 3000ML PPV (MISCELLANEOUS) ×1 IMPLANT
CNTNR URN SCR LID CUP LEK RST (MISCELLANEOUS) ×1 IMPLANT
CONT SPEC 4OZ STRL OR WHT (MISCELLANEOUS)
COTTONBALL LRG STERILE PKG (GAUZE/BANDAGES/DRESSINGS) ×1 IMPLANT
COVER MAYO STAND STRL (DRAPES) ×1 IMPLANT
DRAPE HALF SHEET 40X57 (DRAPES) ×1 IMPLANT
GLOVE BIO SURGEON STRL SZ7.5 (GLOVE) IMPLANT
NDL HYPO 25GX1X1/2 BEV (NEEDLE) IMPLANT
NEEDLE HYPO 25GX1X1/2 BEV (NEEDLE) IMPLANT
NS IRRIG 1000ML POUR BTL (IV SOLUTION) ×1 IMPLANT
PAD ARMBOARD 7.5X6 YLW CONV (MISCELLANEOUS) ×1 IMPLANT
POSITIONER HEAD DONUT 9IN (MISCELLANEOUS) IMPLANT
SYR BULB EAR ULCER 3OZ GRN STR (SYRINGE) IMPLANT
TOWEL GREEN STERILE FF (TOWEL DISPOSABLE) ×1 IMPLANT
TUBE CONNECTING 12X1/4 (SUCTIONS) ×1 IMPLANT
TUBE EAR SHEEHY BUTTON 1.27 (OTOLOGIC RELATED) IMPLANT
TUBE EAR T MOD 1.32X4.8 BL (OTOLOGIC RELATED) IMPLANT

## 2022-04-09 NOTE — Brief Op Note (Signed)
04/09/2022  9:46 AM  PATIENT:  Christopher Caldwell  26 y.o. male  PRE-OPERATIVE DIAGNOSIS:  Otorrhea, left ear Perforation of right tympanic membrane Myringotomy tube status  POST-OPERATIVE DIAGNOSIS:  Otorrhea, left ear Perforation of right tympanic membrane Myringotomy tube status  PROCEDURE:  Procedure(s): MYRINGOTOMY WITH TUBE PLACEMENT (Left)  SURGEON:  Surgeon(s) and Role:    * Christia Reading, MD - Primary  PHYSICIAN ASSISTANT:   ASSISTANTS: none   ANESTHESIA:   general  EBL:  Minimal   BLOOD ADMINISTERED:none  DRAINS: none   LOCAL MEDICATIONS USED:  NONE  SPECIMEN:  No Specimen  DISPOSITION OF SPECIMEN:  N/A  COUNTS:  YES  TOURNIQUET:  * No tourniquets in log *  DICTATION: .Note written in EPIC  PLAN OF CARE: Discharge to home after PACU  PATIENT DISPOSITION:  PACU - hemodynamically stable.   Delay start of Pharmacological VTE agent (>24hrs) due to surgical blood loss or risk of bleeding: no

## 2022-04-09 NOTE — Transfer of Care (Signed)
Immediate Anesthesia Transfer of Care Note  Patient: Christopher Caldwell  Procedure(s) Performed: MYRINGOTOMY WITH TUBE PLACEMENT (Left: Ear)  Patient Location: PACU  Anesthesia Type:General  Level of Consciousness: sedated  Airway & Oxygen Therapy: Patient Spontanous Breathing  Post-op Assessment: Report given to RN and Post -op Vital signs reviewed and stable  Post vital signs: stable  Last Vitals:  Vitals Value Taken Time  BP 82/53 04/09/22 1000  Temp 37.1 C 04/09/22 0955  Pulse 53 04/09/22 1000  Resp 26 04/09/22 1000  SpO2 93 % 04/09/22 1000  Vitals shown include unvalidated device data.  Last Pain:  Vitals:   04/09/22 0727  TempSrc: Axillary         Complications: No notable events documented.

## 2022-04-09 NOTE — Anesthesia Postprocedure Evaluation (Signed)
Anesthesia Post Note  Patient: Christopher Caldwell  Procedure(s) Performed: MYRINGOTOMY WITH TUBE PLACEMENT (Left: Ear)     Patient location during evaluation: PACU Anesthesia Type: General Level of consciousness: awake and alert Pain management: pain level controlled Vital Signs Assessment: post-procedure vital signs reviewed and stable Respiratory status: spontaneous breathing, nonlabored ventilation and respiratory function stable Cardiovascular status: blood pressure returned to baseline Postop Assessment: no apparent nausea or vomiting Anesthetic complications: no   No notable events documented.  Last Vitals:  Vitals:   04/09/22 1015 04/09/22 1030  BP: 97/77 101/78  Pulse: (!) 48 (!) 42  Resp: 17 20  Temp:  (!) 36.3 C  SpO2: 98% 99%    Last Pain:  Vitals:   04/09/22 0727  TempSrc: Axillary                 Shanda Howells

## 2022-04-09 NOTE — H&P (Addendum)
Christopher Caldwell is an 26 y.o. male.   Chief Complaint: Ear infections HPI: 26 year old male with cerebral palsy and non-verbal with history of eustachian tube dysfunction requiring myringotomy tube placement previously now has a right-sided perforation and recurrent otorrhea with a left myringotomy tube in place but with associated granulation.  He presents for replacement of the left tube.  Past Medical History:  Diagnosis Date   Aspiration pneumonia (HCC)    Asthma    Cerebral palsy, quadriplegic (HCC)    Complication of anesthesia    Constipation    Family history of adverse reaction to anesthesia    Mother - Vomitting, Flu symptoms   GERD (gastroesophageal reflux disease)    Hypertension    Lactose intolerance    Legally blind    Pneumonia    PONV (postoperative nausea and vomiting)    did not get sick with 07/13/14 OR- with medication and he woke up better.   RSV (respiratory syncytial virus infection)    Seizures (HCC)    Vision abnormalities     Past Surgical History:  Procedure Laterality Date   ADENOIDECTOMY     CHOLECYSTECTOMY  08/2014   GASTROSTOMY TUBE PLACEMENT     age 98 months- replaced - 05/2014   hamstring resection Bilateral    HIATAL HERNIA REPAIR     MYRINGOTOMY  03/26/2011   Procedure: MYRINGOTOMY;  Surgeon: Leonette Most;  Location: [facility] OR;  Service: ENT;  Laterality: Bilateral;  BILATERAL MYRINGOTOMY    MYRINGOTOMY WITH TUBE PLACEMENT Bilateral 01/06/2014   Procedure: MYRINGOTOMY WITH TUBE PLACEMENT;  Surgeon: Suzanna Obey, MD;  Location: [facility] OR;  Service: ENT;  Laterality: Bilateral;  Bilateral myringotomy with tube placement and nasal endoscopy   MYRINGOTOMY WITH TUBE PLACEMENT Bilateral 07/13/2014   Procedure: MYRINGOTOMY WITH TUBE PLACEMENT;  Surgeon: Suzanna Obey, MD;  Location: [facility] OR;  Service: ENT;  Laterality: Bilateral;  bilateral T Tubes   MYRINGOTOMY WITH TUBE PLACEMENT Bilateral 04/20/2015   Procedure: MYRINGOTOMY WITH TUBE PLACEMENT;  Surgeon: Suzanna Obey,  MD;  Location: [facility] OR;  Service: ENT;  Laterality: Bilateral;   MYRINGOTOMY WITH TUBE PLACEMENT Bilateral 10/15/2015   Procedure: MYRINGOTOMY WITH TUBE PLACEMENT;  Surgeon: Suzanna Obey, MD;  Location: [facility] OR;  Service: ENT;  Laterality: Bilateral;  sheehy tubes    MYRINGOTOMY WITH TUBE PLACEMENT Bilateral 08/20/2017   Procedure: BILATERAL MYRINGOTOMY WITH TUBE PLACEMENT;  Surgeon: Suzanna Obey, MD;  Location: [facility] OR;  Service: ENT;  Laterality: Bilateral;   Nissan Plundication      Family History  Problem Relation Age of Onset   Asthma Mother    Hypertension Mother    Hypertension Father    Asthma Sister    Arthritis Maternal Grandmother    Hypertension Maternal Grandmother    Arthritis Maternal Grandfather    Diabetes Maternal Grandfather    Heart disease Maternal Grandfather    Arthritis Paternal Grandmother    COPD Paternal Grandmother    Social History:  reports that he has never smoked. He has never been exposed to tobacco smoke. He has never used smokeless tobacco. He reports that he does not drink alcohol and does not use drugs.  Allergies:  Allergies  Allergen Reactions   Valproic Acid Other (See Comments)    Increased seizures Toxic at low doses   Albuterol Other (See Comments)    "blood pressure and heart rate increases." Is able to take levalbuterol   Amlodipine Hives   Bioflavonoids Hives   Citrus Hives  Clonidine Hives and Rash   Labetalol Palpitations, Rash and Other (See Comments)   Lactose Other (See Comments)    Lactose Intolerance, GI Upset    Lorazepam Other (See Comments)    Paradoxical reaction    Mangifera Indica Hives    MANGO   Morphine Hives and Nausea And Vomiting   Ondansetron Hives   Strawberry Extract Hives   Vancomycin Other (See Comments)    RED MAN SYNDROME [not an allergy] Mom would rather pt not have this medication due to reaction   Carbamazepine Other (See Comments)    Toxic at low doses   Isradipine Hives and Rash    Medications  Prior to Admission  Medication Sig Dispense Refill   acetaminophen (TYLENOL) 160 MG/5ML liquid Take 640 mg by mouth every 4 (four) hours as needed for pain.     ciprofloxacin-dexamethasone (CIPRODEX) otic suspension Place 5 drops into both ears 2 (two) times daily.     clonazePAM (KLONOPIN) 0.5 MG tablet 0.5 mg by PEG Tube route 4 (four) times daily. Times to take it at 4am, 10am, 3pm, 7pm      diazepam (DIASTAT ACUDIAL) 10 MG GEL Place 15 mg rectally as needed for seizure.      glycopyrrolate (ROBINUL) 2 MG tablet Place 3 mg into feeding tube 2 (two) times daily. Dissolve in water and Give via G tube.     ibuprofen (ADVIL,MOTRIN) 100 MG/5ML suspension Take 400 mg by mouth every 4 (four) hours as needed for mild pain.     lactobacillus acidophilus (BACID) TABS tablet Place 1 tablet into feeding tube daily.      lamoTRIgine (LAMICTAL) 150 MG tablet Place 150 mg into feeding tube 2 (two) times daily. Dissolve in water and give via G tube.     levalbuterol (XOPENEX) 1.25 MG/3ML nebulizer solution Take 1.25 mg by nebulization in the morning, at noon, and at bedtime. May take a 4th dose if needed for shortness of breath     levETIRAcetam (KEPPRA) 100 MG/ML solution Place 800-850 mg into feeding tube See admin instructions. Take 800 mg via tube in the morning and 850 mg in the evening     levocetirizine (XYZAL) 5 MG tablet Place 5 mg into feeding tube daily.     metoprolol tartrate (LOPRESSOR) 50 MG tablet Place 75 mg into feeding tube 2 (two) times daily. Dissolve in water and give via G Tube.     pantoprazole (PROTONIX) 20 MG tablet Place 20 mg into feeding tube 2 (two) times daily.     polyethylene glycol (MIRALAX / GLYCOLAX) packet Place 60 g into feeding tube daily as needed. (Patient taking differently: Take 17 g by mouth every other day.) 14 each 0   temazepam (RESTORIL) 30 MG capsule Place 30 mg into feeding tube at bedtime. Break open, Dissolve in water and give via G tube. Take at 7pm      diazepam (VALIUM) 1 MG/ML solution Place 15 mg into feeding tube 3 (three) times daily as needed (seizures. up to three times within 30 minutes).      Ferrous Sulfate 220 (44 Fe) MG/5ML SOLN Take 1 teaspoonful (5 ml) daily (Patient not taking: Reported on 04/08/2022) 473 mL 0   levalbuterol (XOPENEX) 0.63 MG/3ML nebulizer solution Take 3 mLs (0.63 mg total) by nebulization every 4 (four) hours as needed for wheezing or shortness of breath. (Patient not taking: Reported on 04/08/2022) 3 mL 12    Results for orders placed or performed during the hospital encounter  of 04/09/22 (from the past 48 hour(s))  Basic metabolic panel per protocol     Status: None   Collection Time: 04/09/22  8:06 AM  Result Value Ref Range   Sodium 136 135 - 145 mmol/L   Potassium 3.9 3.5 - 5.1 mmol/L   Chloride 101 98 - 111 mmol/L   CO2 27 22 - 32 mmol/L   Glucose, Bld 80 70 - 99 mg/dL    Comment: Glucose reference range applies only to samples taken after fasting for at least 8 hours.   BUN 19 6 - 20 mg/dL   Creatinine, Ser 0.45 0.61 - 1.24 mg/dL   Calcium 9.4 8.9 - 99.7 mg/dL   GFR, Estimated >74 >14 mL/min    Comment: (NOTE) Calculated using the CKD-EPI Creatinine Equation (2021)    Anion gap 8 5 - 15    Comment: Performed at [facility], [address], [place], [place] [address]  CBC per protocol     Status: None   Collection Time: 04/09/22  8:06 AM  Result Value Ref Range   WBC 6.3 4.0 - 10.5 K/uL   RBC 4.84 4.22 - 5.81 MIL/uL   Hemoglobin 15.2 13.0 - 17.0 g/dL   HCT 20.2 33.4 - 35.6 %   MCV 91.1 80.0 - 100.0 fL   MCH 31.4 26.0 - 34.0 pg   MCHC 34.5 30.0 - 36.0 g/dL   RDW 86.1 68.3 - 72.9 %   Platelets 191 150 - 400 K/uL   nRBC 0.0 0.0 - 0.2 %    Comment: Performed at [facility], [address], [place], [place] [address]   No results found.  Review of Systems  Unable to perform ROS: Patient nonverbal    Blood pressure 122/88, pulse (!) 46, temperature (!) 97.5 F (36.4 C),  temperature source Axillary, resp. rate 20, height 5' (1.524 m), weight 49.9 kg, SpO2 97 %. Physical Exam Constitutional:      Appearance: He is normal weight.  HENT:     Head: Normocephalic and atraumatic.     Right Ear: External ear normal.     Left Ear: External ear normal.     Nose: Nose normal.     Mouth/Throat:     Mouth: Mucous membranes are moist.     Pharynx: Oropharynx is clear.  Eyes:     Extraocular Movements: Extraocular movements intact.     Conjunctiva/sclera: Conjunctivae normal.     Pupils: Pupils are equal, round, and reactive to light.  Cardiovascular:     Rate and Rhythm: Normal rate.  Pulmonary:     Effort: Pulmonary effort is normal.  Musculoskeletal:     Cervical back: Normal range of motion.  Skin:    General: Skin is warm and dry.  Neurological:     General: No focal deficit present.     Mental Status: He is alert.      Assessment/Plan Eustachian tube dysfunction, right TM perforation  To OR for left T-tube placement.  Christia Reading, MD 04/09/2022, 9:00 AM

## 2022-04-09 NOTE — Anesthesia Preprocedure Evaluation (Addendum)
Anesthesia Evaluation  Patient identified by MRN, date of birth, ID band Patient awake    Reviewed: Allergy & Precautions, NPO status , Patient's Chart, lab work & pertinent test results, reviewed documented beta blocker date and time   History of Anesthesia Complications (+) PONV and history of anesthetic complications  Airway Mallampati: Unable to assess  TM Distance: >3 FB Neck ROM: Full    Dental no notable dental hx.    Pulmonary asthma    Pulmonary exam normal        Cardiovascular hypertension, Pt. on home beta blockers and Pt. on medications Normal cardiovascular exam     Neuro/Psych Seizures - (daily), Poorly Controlled,  Cerebral palsy, quadriplegic    GI/Hepatic ,GERD  Controlled and Medicated,,  Endo/Other    Renal/GU      Musculoskeletal   Abdominal   Peds  Hematology   Anesthesia Other Findings   Reproductive/Obstetrics                              Anesthesia Physical Anesthesia Plan  ASA: 3  Anesthesia Plan: General   Post-op Pain Management: Toradol IV (intra-op)* and Ofirmev IV (intra-op)*   Induction: Intravenous  PONV Risk Score and Plan: 3 and Treatment may vary due to age or medical condition, Midazolam and Propofol infusion  Airway Management Planned: Mask  Additional Equipment: None  Intra-op Plan:   Post-operative Plan:   Informed Consent: I have reviewed the patients History and Physical, chart, labs and discussed the procedure including the risks, benefits and alternatives for the proposed anesthesia with the patient or authorized representative who has indicated his/her understanding and acceptance.     Consent reviewed with POA  Plan Discussed with: CRNA  Anesthesia Plan Comments:        Anesthesia Quick Evaluation

## 2022-04-09 NOTE — Op Note (Signed)
Preop diagnosis: Eustachian tube dysfunction, myringotomy tube status, right TM perforation Postop diagnosis: same Procedure: Left myringotomy with T-tube placement Surgeon: Jenne Pane Anesth: General Compl: None Findings: Retained left fluoroplastic myringotomy tube with mild crusting and associated granulation tissue.  Tube replaced with a T-tube.  Right TM with 30% central perforation without significant otorrhea. Description:  After discussing risks, benefits, and alternatives, the patient was brought to the operative suite and placed on the operative table in the supine position.  Anesthesia was induced and the patient was maintained via mask ventilation.  The left ear was inspected under the operating microscope using an ear speculum.  The retained fluoroplastic myringotomy tube was removed with micro forceps.  The ear was suctioned.  A Richards modified T-tube was placed followed by Ciprodex drops and a cotton ball.  The right ear was then inspected but no intervention was necessary.  After completion, the patient was returned to anesthesia for wake-up and moved to the recovery room in stable condition.

## 2022-04-10 ENCOUNTER — Encounter (HOSPITAL_COMMUNITY): Payer: Self-pay | Admitting: Otolaryngology

## 2022-10-29 DIAGNOSIS — H6121 Impacted cerumen, right ear: Secondary | ICD-10-CM | POA: Insufficient documentation

## 2023-06-23 DIAGNOSIS — H9210 Otorrhea, unspecified ear: Secondary | ICD-10-CM | POA: Insufficient documentation

## 2024-04-15 DIAGNOSIS — H6123 Impacted cerumen, bilateral: Secondary | ICD-10-CM | POA: Insufficient documentation

## 2024-04-15 DIAGNOSIS — R234 Changes in skin texture: Secondary | ICD-10-CM | POA: Insufficient documentation

## 2024-04-15 DIAGNOSIS — H5789 Other specified disorders of eye and adnexa: Secondary | ICD-10-CM | POA: Insufficient documentation

## 2024-05-11 ENCOUNTER — Ambulatory Visit: Payer: MEDICAID | Admitting: Podiatry

## 2024-05-11 DIAGNOSIS — G801 Spastic diplegic cerebral palsy: Secondary | ICD-10-CM | POA: Insufficient documentation

## 2024-05-11 DIAGNOSIS — L600 Ingrowing nail: Secondary | ICD-10-CM

## 2024-05-11 NOTE — Progress Notes (Signed)
 "  Subjective:  Patient ID: Christopher Caldwell, male    DOB: 1996-02-07,  MRN: [id number] HPI Chief Complaint  Patient presents with   Ingrown Toenail    NP - left hallux possible ingrown toenail -multiple health issues - patient does not walk    29 y.o. male presents with the above complaint.   He is nonverbal cerebral palsy quadriplegic wheelchair-bound  Past Medical History:  Diagnosis Date   Aspiration pneumonia (HCC)    Asthma    Cerebral palsy, quadriplegic (HCC)    Complication of anesthesia    Constipation    Family history of adverse reaction to anesthesia    Mother - Vomitting, Flu symptoms   GERD (gastroesophageal reflux disease)    Hypertension    Lactose intolerance    Legally blind    Pneumonia    PONV (postoperative nausea and vomiting)    did not get sick with 07/13/14 OR- with medication and he woke up better.   RSV (respiratory syncytial virus infection)    Seizures (HCC)    Vision abnormalities    Past Surgical History:  Procedure Laterality Date   ADENOIDECTOMY     CHOLECYSTECTOMY  08/2014   GASTROSTOMY TUBE PLACEMENT     age 6 months- replaced - 05/2014   hamstring resection Bilateral    HIATAL HERNIA REPAIR     MYRINGOTOMY  03/26/2011   Procedure: MYRINGOTOMY;  Surgeon: Norleen CHRISTELLA Notice;  Location: [facility] OR;  Service: ENT;  Laterality: Bilateral;  BILATERAL MYRINGOTOMY    MYRINGOTOMY WITH TUBE PLACEMENT Bilateral 01/06/2014   Procedure: MYRINGOTOMY WITH TUBE PLACEMENT;  Surgeon: Norleen Notice, MD;  Location: [facility] OR;  Service: ENT;  Laterality: Bilateral;  Bilateral myringotomy with tube placement and nasal endoscopy   MYRINGOTOMY WITH TUBE PLACEMENT Bilateral 07/13/2014   Procedure: MYRINGOTOMY WITH TUBE PLACEMENT;  Surgeon: Norleen Notice, MD;  Location: [facility] OR;  Service: ENT;  Laterality: Bilateral;  bilateral T Tubes   MYRINGOTOMY WITH TUBE PLACEMENT Bilateral 04/20/2015   Procedure: MYRINGOTOMY WITH TUBE PLACEMENT;  Surgeon: Norleen Notice, MD;  Location: [facility] OR;  Service:  ENT;  Laterality: Bilateral;   MYRINGOTOMY WITH TUBE PLACEMENT Bilateral 10/15/2015   Procedure: MYRINGOTOMY WITH TUBE PLACEMENT;  Surgeon: Norleen Notice, MD;  Location: [facility] OR;  Service: ENT;  Laterality: Bilateral;  sheehy tubes    MYRINGOTOMY WITH TUBE PLACEMENT Bilateral 08/20/2017   Procedure: BILATERAL MYRINGOTOMY WITH TUBE PLACEMENT;  Surgeon: Notice Norleen, MD;  Location: [facility] OR;  Service: ENT;  Laterality: Bilateral;   MYRINGOTOMY WITH TUBE PLACEMENT Left 04/09/2022   Procedure: MYRINGOTOMY WITH TUBE PLACEMENT;  Surgeon: Carlie Clark, MD;  Location: [facility] OR;  Service: ENT;  Laterality: Left;   Nissan Plundication     Current Medications[1]  Allergies[2] Review of Systems Objective:  There were no vitals filed for this visit.  General: Well developed, nourished, in no acute distress, alert and oriented x3   Dermatological: Skin is warm, dry and supple bilateral. Nails x 10 are well maintained; with exception of the hallux left which does demonstrate mild paronychia.  Margins are debrided there is no signs of infection.  Remaining integument appears unremarkable at this time. There are no open sores, no preulcerative lesions, no rash or signs of infection present.  Vascular: Dorsalis Pedis artery and Posterior Tibial artery pedal pulses are 2/4 bilateral with immedate capillary fill time. Pedal hair growth present. No varicosities and no lower extremity edema present bilateral.   Neruologic: No reaction or clonus noted bilateral.  Musculoskeletal: Flaccid musculature Gait: Wheelchair-bound   Radiographs:  None taken  Assessment & Plan:   Assessment: Mild paronychia hallux left  Plan: Debrided margins today no purulence no malodor     Asusena Sigley T. Tomasz Steeves, DPM    [1]  Current Outpatient Medications:    fluticasone (FLOVENT HFA) 44 MCG/ACT inhaler, Inhale 1 puff into the lungs., Disp: , Rfl:    Midazolam  5 MG/0.1ML SOLN, Place 5 mg into the nose., Disp: , Rfl:    nystatin   (MYCOSTATIN /NYSTOP ) powder, APPLY TO AFFECTED AREAS TWICE DAILY AS NEEDED., Disp: , Rfl:    sucralfate (CARAFATE) 1 GM/10ML suspension, Take 1 g by mouth., Disp: , Rfl:    tobramycin-dexamethasone  (TOBRADEX) ophthalmic solution, Administer 4 drops into affected ear(s) in the morning and 4 drops in the affected ear(s) in the evening for 7 days., Disp: , Rfl:    acetaminophen  (TYLENOL ) 160 MG/5ML liquid, Take 640 mg by mouth every 4 (four) hours as needed for pain., Disp: , Rfl:    ciprofloxacin -dexamethasone  (CIPRODEX ) otic suspension, Place 5 drops into both ears 2 (two) times daily., Disp: , Rfl:    clonazePAM  (KLONOPIN ) 0.5 MG tablet, 0.5 mg by PEG Tube route 4 (four) times daily. Times to take it at 4am, 10am, 3pm, 7pm , Disp: , Rfl:    diazepam  (DIASTAT  ACUDIAL) 10 MG GEL, Place 15 mg rectally as needed for seizure. , Disp: , Rfl:    diazepam  (VALIUM ) 1 MG/ML solution, Place 15 mg into feeding tube 3 (three) times daily as needed (seizures. up to three times within 30 minutes). , Disp: , Rfl:    Ferrous Sulfate  220 (44 Fe) MG/5ML SOLN, Take 1 teaspoonful (5 ml) daily (Patient not taking: Reported on 04/08/2022), Disp: 473 mL, Rfl: 0   glycopyrrolate  (ROBINUL ) 2 MG tablet, Place 3 mg into feeding tube 2 (two) times daily. Dissolve in water and Give via G tube., Disp: , Rfl:    ibuprofen  (ADVIL ,MOTRIN ) 100 MG/5ML suspension, Take 400 mg by mouth every 4 (four) hours as needed for mild pain., Disp: , Rfl:    lactobacillus acidophilus (BACID) TABS tablet, Place 1 tablet into feeding tube daily. , Disp: , Rfl:    lamoTRIgine  (LAMICTAL ) 150 MG tablet, Place 150 mg into feeding tube 2 (two) times daily. Dissolve in water and give via G tube., Disp: , Rfl:    levalbuterol  (XOPENEX ) 0.63 MG/3ML nebulizer solution, Take 3 mLs (0.63 mg total) by nebulization every 4 (four) hours as needed for wheezing or shortness of breath. (Patient not taking: Reported on 04/08/2022), Disp: 3 mL, Rfl: 12   levalbuterol   (XOPENEX ) 1.25 MG/3ML nebulizer solution, Take 1.25 mg by nebulization in the morning, at noon, and at bedtime. May take a 4th dose if needed for shortness of breath, Disp: , Rfl:    levETIRAcetam  (KEPPRA ) 100 MG/ML solution, Place 800-850 mg into feeding tube See admin instructions. Take 800 mg via tube in the morning and 850 mg in the evening, Disp: , Rfl:    levocetirizine (XYZAL) 5 MG tablet, Place 5 mg into feeding tube daily., Disp: , Rfl:    metoprolol  tartrate (LOPRESSOR ) 50 MG tablet, Place 75 mg into feeding tube 2 (two) times daily. Dissolve in water and give via G Tube., Disp: , Rfl:    pantoprazole  (PROTONIX ) 20 MG tablet, Place 20 mg into feeding tube 2 (two) times daily., Disp: , Rfl:    polyethylene glycol (MIRALAX  / GLYCOLAX ) packet, Place 60 g into feeding tube daily as needed. (  Patient taking differently: Take 17 g by mouth every other day.), Disp: 14 each, Rfl: 0   temazepam  (RESTORIL ) 30 MG capsule, Place 30 mg into feeding tube at bedtime. Break open, Dissolve in water and give via G tube. Take at 7pm, Disp: , Rfl:  [2]  Allergies Allergen Reactions   Valproic Acid Other (See Comments)    Increased seizures Toxic at low doses   Albuterol  Other (See Comments)    blood pressure and heart rate increases. Is able to take levalbuterol    Amlodipine Hives   Bioflavonoids Hives   Citrus Hives   Clonidine Hives and Rash   Labetalol Palpitations, Rash and Other (See Comments)   Lactose Other (See Comments)    Lactose Intolerance, GI Upset    Lorazepam Other (See Comments)    Paradoxical reaction    Mangifera Indica Hives    MANGO   Morphine  Hives and Nausea And Vomiting   Ondansetron  Hives   Strawberry Extract Hives   Vancomycin Other (See Comments)    RED MAN SYNDROME [not an allergy] Mom would rather pt not have this medication due to reaction   Carbamazepine Other (See Comments)    Toxic at low doses   Isradipine Hives and Rash   "
